# Patient Record
Sex: Female | Born: 1980 | Race: Asian | Hispanic: No | Marital: Married
Health system: Northeastern US, Academic
[De-identification: ages and names within clinical notes are randomized; demographics above are authoritative.]

## PROBLEM LIST (undated history)

## (undated) ENCOUNTER — Inpatient Hospital Stay (HOSPITAL_COMMUNITY): Payer: Self-pay

## (undated) DIAGNOSIS — Z8619 Personal history of other infectious and parasitic diseases: Secondary | ICD-10-CM

## (undated) DIAGNOSIS — J45909 Unspecified asthma, uncomplicated: Secondary | ICD-10-CM

## (undated) DIAGNOSIS — R51 Headache: Secondary | ICD-10-CM

## (undated) DIAGNOSIS — IMO0002 Reserved for concepts with insufficient information to code with codable children: Secondary | ICD-10-CM

## (undated) DIAGNOSIS — R87619 Unspecified abnormal cytological findings in specimens from cervix uteri: Secondary | ICD-10-CM

## (undated) HISTORY — DX: Personal history of other infectious and parasitic diseases: Z86.19

## (undated) HISTORY — PX: DILATION AND CURETTAGE OF UTERUS: SHX78

## (undated) HISTORY — DX: Headache: R51

## (undated) HISTORY — PX: MOUTH SURGERY: SHX715

## (undated) HISTORY — PX: COLONOSCOPY: SHX174

## (undated) HISTORY — PX: WISDOM TOOTH EXTRACTION: SHX21

---

## 2012-02-02 NOTE — L&D Delivery Note (Signed)
NSVD of [facility] at 1229 on 12/16/12.  EBL: 300cc.  APGARs 9,9.  Placenta to L&D. Head delivered ROA with body delivered atraumatically.  Mouth and nose bulb suctioned.  Cord clamped, cut and baby to abdomen.  Cord pH obtained.  Placenta delivered S/I/3VC.  Fundus firmed with pitocin and massage.  First degree perineal lac repaired with 3-0 Vicryl.  Mom and baby stable.

## 2012-05-17 LAB — OB RESULTS CONSOLE HIV ANTIBODY (ROUTINE TESTING): HIV: NONREACTIVE

## 2012-05-17 LAB — OB RESULTS CONSOLE ABO/RH: RH Type: POSITIVE

## 2012-05-17 LAB — OB RESULTS CONSOLE RUBELLA ANTIBODY, IGM: Rubella: IMMUNE

## 2012-05-17 LAB — OB RESULTS CONSOLE RPR: RPR: NONREACTIVE

## 2012-05-17 LAB — OB RESULTS CONSOLE HEPATITIS B SURFACE ANTIGEN: Hepatitis B Surface Ag: NEGATIVE

## 2012-09-22 ENCOUNTER — Encounter (HOSPITAL_COMMUNITY): Payer: Self-pay

## 2012-09-22 ENCOUNTER — Inpatient Hospital Stay (HOSPITAL_COMMUNITY)
Admission: AD | Admit: 2012-09-22 | Discharge: 2012-09-22 | Disposition: A | Discharge status: Home / Self Care | Payer: Managed Care, Other (non HMO) | Source: Ambulatory Visit | Attending: Obstetrics and Gynecology | Admitting: Obstetrics and Gynecology

## 2012-09-22 DIAGNOSIS — R109 Unspecified abdominal pain: Secondary | ICD-10-CM | POA: Insufficient documentation

## 2012-09-22 DIAGNOSIS — O99891 Other specified diseases and conditions complicating pregnancy: Secondary | ICD-10-CM | POA: Insufficient documentation

## 2012-09-22 DIAGNOSIS — O26899 Other specified pregnancy related conditions, unspecified trimester: Secondary | ICD-10-CM

## 2012-09-22 DIAGNOSIS — O9989 Other specified diseases and conditions complicating pregnancy, childbirth and the puerperium: Secondary | ICD-10-CM

## 2012-09-22 DIAGNOSIS — N949 Unspecified condition associated with female genital organs and menstrual cycle: Secondary | ICD-10-CM

## 2012-09-22 HISTORY — DX: Unspecified asthma, uncomplicated: J45.909

## 2012-09-22 LAB — URINALYSIS, ROUTINE W REFLEX MICROSCOPIC
Bilirubin Urine: NEGATIVE
Nitrite: NEGATIVE
Protein, ur: NEGATIVE mg/dL
Specific Gravity, Urine: <1.005 — ABNORMAL LOW (ref 1.005–1.030)
Urobilinogen, UA: 0.2 mg/dL (ref 0.0–1.0)

## 2012-09-22 LAB — WET PREP, GENITAL: Trich, Wet Prep: NONE SEEN

## 2012-09-22 LAB — URINE MICROSCOPIC-ADD ON

## 2012-09-22 NOTE — MAU Note (Signed)
Woke up at 1 this morning with menstrual like cramping. Denies LOF, VB or discharge. Positive fetal movement.

## 2012-09-22 NOTE — MAU Provider Note (Signed)
History     CSN: [id number]  Arrival date and time: 09/22/12 0226   First Provider Initiated Contact with Patient 09/22/12 0300      Chief Complaint  Patient presents with  . Abdominal Cramping   Abdominal Cramping    Tammy Foster is a 32 y.o. G1P0000 at [redacted]w[redacted]d who presents tonight with cramping. She states that around 0100 she woke up to a dull, lower abdominal ache. She states that the pain was constant and did not come and do. She states that it has gotten better now. She denies any bleeding or LOF and confirms fetal movement.   Past Medical History  Diagnosis Date  . Asthma     Past Surgical History  Procedure Laterality Date  . Mouth surgery      Family History  Problem Relation Age of Onset  . Adopted: Yes    History  Substance Use Topics  . Smoking status: Never Smoker   . Smokeless tobacco: Not on file  . Alcohol Use: No    Allergies:  Allergies  Allergen Reactions  . Neomycin Other (See Comments)    Reaction as child. "severe burn like reaction to skin"    Prescriptions prior to admission  Medication Sig Dispense Refill  . Prenatal Vit-Fe Fumarate-FA (PRENATAL MULTIVITAMIN) TABS tablet Take 1 tablet by mouth daily at 12 noon.        ROS Physical Exam   Blood pressure 117/79, pulse 90, temperature 98.7 F (37.1 C), temperature source Oral, resp. rate 18, height 5\' 4"  (1.626 m), weight 84.188 kg (185 lb 9.6 oz).  Physical Exam  Nursing note and vitals reviewed. Constitutional: She is oriented to person, place, and time. She appears well-developed and well-nourished. No distress.  Cardiovascular: Normal rate.   Respiratory: Effort normal.  GI: Soft. There is no tenderness.  Genitourinary:   External: no lesion Vagina: small amount of white discharge Cervix: pink, smooth, closed/thick/high  Uterus: AGA   Neurological: She is alert and oriented to person, place, and time.  Skin: Skin is warm and dry.  Psychiatric: She has a normal mood  and affect.   FHT: 140, moderate with 15x15 accels, no decels Toco: no UCs MAU Course  Procedures  Results for orders placed during the hospital encounter of 09/22/12 (from the past 24 hour(s))  URINALYSIS, ROUTINE W REFLEX MICROSCOPIC     Status: Abnormal   Collection Time    09/22/12  2:31 AM      Result Value Range   Color, Urine YELLOW  YELLOW   APPearance CLEAR  CLEAR   Specific Gravity, Urine <1.005 (*) 1.005 - 1.030   pH 7.0  5.0 - 8.0   Glucose, UA NEGATIVE  NEGATIVE mg/dL   Hgb urine dipstick TRACE (*) NEGATIVE   Bilirubin Urine NEGATIVE  NEGATIVE   Ketones, ur NEGATIVE  NEGATIVE mg/dL   Protein, ur NEGATIVE  NEGATIVE mg/dL   Urobilinogen, UA 0.2  0.0 - 1.0 mg/dL   Nitrite NEGATIVE  NEGATIVE   Leukocytes, UA NEGATIVE  NEGATIVE  URINE MICROSCOPIC-ADD ON     Status: Abnormal   Collection Time    09/22/12  2:31 AM      Result Value Range   Squamous Epithelial / LPF RARE  RARE   WBC, UA 0-2  <3 WBC/hpf   RBC / HPF 0-2  <3 RBC/hpf   Bacteria, UA FEW (*) RARE  WET PREP, GENITAL     Status: Abnormal   Collection Time    09/22/12  3:10 AM      Result Value Range   Yeast Wet Prep HPF POC NONE SEEN  NONE SEEN   Trich, Wet Prep NONE SEEN  NONE SEEN   Clue Cells Wet Prep HPF POC MODERATE (*) NONE SEEN   WBC, Wet Prep HPF POC FEW (*) NONE SEEN    Assessment and Plan   1. Pain of round ligament complicating pregnancy, antepartum    FU with the office as planned PTL danger signs reviewed Return to MAU as needed   Tawnya Crook 09/22/2012, 3:36 AM

## 2012-12-07 ENCOUNTER — Inpatient Hospital Stay (HOSPITAL_COMMUNITY)
Admission: AD | Admit: 2012-12-07 | Discharge: 2012-12-07 | Disposition: A | Discharge status: Home / Self Care | Payer: Managed Care, Other (non HMO) | Source: Ambulatory Visit | Attending: Obstetrics & Gynecology | Admitting: Obstetrics & Gynecology

## 2012-12-07 ENCOUNTER — Encounter (HOSPITAL_COMMUNITY): Payer: Self-pay

## 2012-12-07 DIAGNOSIS — M549 Dorsalgia, unspecified: Secondary | ICD-10-CM

## 2012-12-07 DIAGNOSIS — M7918 Myalgia, other site: Secondary | ICD-10-CM

## 2012-12-07 DIAGNOSIS — IMO0001 Reserved for inherently not codable concepts without codable children: Secondary | ICD-10-CM

## 2012-12-07 DIAGNOSIS — M545 Low back pain, unspecified: Secondary | ICD-10-CM | POA: Insufficient documentation

## 2012-12-07 DIAGNOSIS — O99891 Other specified diseases and conditions complicating pregnancy: Secondary | ICD-10-CM | POA: Insufficient documentation

## 2012-12-07 LAB — URINALYSIS, ROUTINE W REFLEX MICROSCOPIC
Glucose, UA: NEGATIVE mg/dL
Nitrite: NEGATIVE
Protein, ur: NEGATIVE mg/dL
Urobilinogen, UA: 0.2 mg/dL (ref 0.0–1.0)

## 2012-12-07 LAB — URINE MICROSCOPIC-ADD ON

## 2012-12-07 MED ORDER — CYCLOBENZAPRINE HCL 10 MG PO TABS
10.0000 mg | ORAL_TABLET | Freq: Once | ORAL | Status: AC
  Administered 2012-12-07: 10 mg via ORAL
  Filled 2012-12-07: qty 1

## 2012-12-07 MED ORDER — CYCLOBENZAPRINE HCL 5 MG PO TABS: 5.0000 mg | ORAL_TABLET | Freq: Three times a day (TID) | ORAL | Status: DC | PRN

## 2012-12-07 NOTE — MAU Note (Signed)
Pt states constant back pain that began around 0315. Nothing has helped. Denies vaginal bleeding and discharge. Good FM.

## 2012-12-07 NOTE — MAU Provider Note (Signed)
Chief Complaint:  Back Pain  First Provider Initiated Contact with Patient 12/07/12 0603     HPI: Tammy Foster is a 32 y.o. G1P0000 at [redacted]w[redacted]d who presents to maternity admissions reporting waking up with constant, severe low-mid back pain at 3:30 this morning. No improvement with ambulation, position changes. Denies contractions, leakage of fluid, vaginal bleeding, fever, chills, urinary complaints, recent injury or strain, . Good fetal movement.   Pregnancy Course: Uncomplicated.   Past Medical History: Past Medical History  Diagnosis Date  . Asthma     Past obstetric history: OB History  Gravida Para Term Preterm AB SAB TAB Ectopic Multiple Living  1 0 0 0 0 0 0 0 0 0     # Outcome Date GA Lbr Len/2nd Weight Sex Delivery Anes PTL Lv  1 CUR               Past Surgical History: Past Surgical History  Procedure Laterality Date  . Mouth surgery       Family History: Family History  Problem Relation Age of Onset  . Adopted: Yes    Social History: History  Substance Use Topics  . Smoking status: Never Smoker   . Smokeless tobacco: Not on file  . Alcohol Use: No    Allergies:  Allergies  Allergen Reactions  . Neomycin Other (See Comments)    Reaction as child. "severe burn like reaction to skin"    Meds:  Prescriptions prior to admission  Medication Sig Dispense Refill  . Prenatal Vit-Fe Fumarate-FA (PRENATAL MULTIVITAMIN) TABS tablet Take 1 tablet by mouth daily at 12 noon.        ROS: Pertinent findings in history of present illness.   Physical Exam  Blood pressure 120/79, pulse 73, temperature 97.7 F (36.5 C), temperature source Oral, resp. rate 18, SpO2 100.00%. GENERAL: Well-developed, well-nourished female in mild distress.   HEENT: normocephalic HEART: normal rate RESP: normal effort ABDOMEN: Soft, non-tender, gravid appropriate for gestational age. BACK: mild ML low and mid back tenderness. No CVAT.  EXTREMITIES: Nontender, no edema NEURO:  alert and oriented SPECULUM EXAM: NEFG, physiologic discharge, no blood, cervix clean Dilation: 1 Effacement (%): 70 Station: -3 Presentation: Vertex Exam by:: D Simpson RN  FHT:  Baseline 130's, moderate variability, accelerations present, no decelerations Contractions: None   Labs: Results for orders placed during the hospital encounter of 12/07/12 (from the past 24 hour(s))  URINALYSIS, ROUTINE W REFLEX MICROSCOPIC     Status: Abnormal   Collection Time    12/07/12  4:10 AM      Result Value Range   Color, Urine YELLOW  YELLOW   APPearance CLEAR  CLEAR   Specific Gravity, Urine 1.010  1.005 - 1.030   pH 6.5  5.0 - 8.0   Glucose, UA NEGATIVE  NEGATIVE mg/dL   Hgb urine dipstick NEGATIVE  NEGATIVE   Bilirubin Urine NEGATIVE  NEGATIVE   Ketones, ur NEGATIVE  NEGATIVE mg/dL   Protein, ur NEGATIVE  NEGATIVE mg/dL   Urobilinogen, UA 0.2  0.0 - 1.0 mg/dL   Nitrite NEGATIVE  NEGATIVE   Leukocytes, UA TRACE (*) NEGATIVE  URINE MICROSCOPIC-ADD ON     Status: None   Collection Time    12/07/12  4:10 AM      Result Value Range   Squamous Epithelial / LPF RARE  RARE   WBC, UA 0-2  <3 WBC/hpf   Bacteria, UA RARE  RARE    Imaging:  No results found. MAU Course: Apply  heat to low back. Flexor ordered. Encouraged patient to avoid semi-Fowler's position.  Pain 2/10 now. Feeling much better.   Assessment: 1. Musculoskeletal pain   2. Back pain complicating pregnancy in third trimester     Plan: Discharge home in stable condition. Labor precautions and fetal kick counts. Comfort measures.  Follow-up Information   Follow up with MORRIS, MEGAN, DO. (as scheduled or as needed if symptoms worsen)    Specialty:  Obstetrics and Gynecology   Contact information:   [address], [address] [place] [address] [phone]       Follow up with [facility] [place] MATERN[facility] ADMISSIONS. (As needed if symptoms worsen)    Contact  information:   [address] [id number] [place] [address] [phone]        Medication List         cyclobenzaprine 5 MG tablet  Commonly known as:  FLEXERIL  Take 1-2 tablets (5-10 mg total) by mouth 3 (three) times daily as needed for muscle spasms.     prenatal multivitamin Tabs tablet  Take 1 tablet by mouth daily at 12 noon.       [place], CNM 12/07/2012 6:27 AM

## 2012-12-07 NOTE — MAU Note (Signed)
Per V Smith CNM, pt may come off monitor 

## 2012-12-15 ENCOUNTER — Telehealth (HOSPITAL_COMMUNITY): Payer: Self-pay | Admitting: *Deleted

## 2012-12-15 ENCOUNTER — Encounter (HOSPITAL_COMMUNITY): Payer: Self-pay | Admitting: *Deleted

## 2012-12-15 LAB — OB RESULTS CONSOLE GBS: GBS: NEGATIVE

## 2012-12-15 NOTE — Telephone Encounter (Signed)
Preadmission screen  

## 2012-12-16 ENCOUNTER — Encounter (HOSPITAL_COMMUNITY): Payer: Managed Care, Other (non HMO) | Admitting: Anesthesiology

## 2012-12-16 ENCOUNTER — Inpatient Hospital Stay (HOSPITAL_COMMUNITY): Payer: Managed Care, Other (non HMO) | Admitting: Anesthesiology

## 2012-12-16 ENCOUNTER — Encounter (HOSPITAL_COMMUNITY): Payer: Self-pay | Admitting: *Deleted

## 2012-12-16 ENCOUNTER — Inpatient Hospital Stay (HOSPITAL_COMMUNITY)
Admission: AD | Admit: 2012-12-16 | Discharge: 2012-12-18 | DRG: 775 | Disposition: A | Discharge status: Home / Self Care | Payer: Managed Care, Other (non HMO) | Source: Ambulatory Visit | Attending: Obstetrics & Gynecology | Admitting: Obstetrics & Gynecology

## 2012-12-16 DIAGNOSIS — O368190 Decreased fetal movements, unspecified trimester, not applicable or unspecified: Secondary | ICD-10-CM | POA: Diagnosis present

## 2012-12-16 HISTORY — DX: Unspecified abnormal cytological findings in specimens from cervix uteri: R87.619

## 2012-12-16 HISTORY — DX: Reserved for concepts with insufficient information to code with codable children: IMO0002

## 2012-12-16 LAB — CBC
Hemoglobin: 14 g/dL (ref 12.0–15.0)
MCH: 30.1 pg (ref 26.0–34.0)
MCV: 86 fL (ref 78.0–100.0)
Platelets: 217 10*3/uL (ref 150–400)
RBC: 4.65 MIL/uL (ref 3.87–5.11)
RDW: 13.5 % (ref 11.5–15.5)
WBC: 11.5 10*3/uL — ABNORMAL HIGH (ref 4.0–10.5)

## 2012-12-16 LAB — RPR: RPR Ser Ql: NONREACTIVE

## 2012-12-16 LAB — TYPE AND SCREEN
ABO/RH(D): O POS
Antibody Screen: NEGATIVE

## 2012-12-16 LAB — ABO/RH: ABO/RH(D): O POS

## 2012-12-16 MED ORDER — OXYCODONE-ACETAMINOPHEN 5-325 MG PO TABS: 1.0000 | ORAL_TABLET | ORAL | Status: DC | PRN

## 2012-12-16 MED ORDER — DIPHENHYDRAMINE HCL 25 MG PO CAPS: 25.0000 mg | ORAL_CAPSULE | Freq: Four times a day (QID) | ORAL | Status: DC | PRN

## 2012-12-16 MED ORDER — ONDANSETRON HCL 4 MG PO TABS: 4.0000 mg | ORAL_TABLET | ORAL | Status: DC | PRN

## 2012-12-16 MED ORDER — ZOLPIDEM TARTRATE 5 MG PO TABS: 5.0000 mg | ORAL_TABLET | Freq: Every evening | ORAL | Status: DC | PRN

## 2012-12-16 MED ORDER — PHENYLEPHRINE 40 MCG/ML (10ML) SYRINGE FOR IV PUSH (FOR BLOOD PRESSURE SUPPORT)
80.0000 ug | PREFILLED_SYRINGE | INTRAVENOUS | Status: DC | PRN
  Filled 2012-12-16: qty 2
  Filled 2012-12-16: qty 10

## 2012-12-16 MED ORDER — IBUPROFEN 600 MG PO TABS
600.0000 mg | ORAL_TABLET | Freq: Four times a day (QID) | ORAL | Status: DC
  Administered 2012-12-16 – 2012-12-18 (×7): 600 mg via ORAL
  Filled 2012-12-16 (×9): qty 1

## 2012-12-16 MED ORDER — ONDANSETRON HCL 4 MG/2ML IJ SOLN: 4.0000 mg | INTRAMUSCULAR | Status: DC | PRN

## 2012-12-16 MED ORDER — ACETAMINOPHEN 325 MG PO TABS: 650.0000 mg | ORAL_TABLET | ORAL | Status: DC | PRN

## 2012-12-16 MED ORDER — DIBUCAINE 1 % RE OINT: 1.0000 "application " | TOPICAL_OINTMENT | RECTAL | Status: DC | PRN

## 2012-12-16 MED ORDER — ONDANSETRON HCL 4 MG/2ML IJ SOLN: 4.0000 mg | Freq: Four times a day (QID) | INTRAMUSCULAR | Status: DC | PRN

## 2012-12-16 MED ORDER — FENTANYL 2.5 MCG/ML BUPIVACAINE 1/10 % EPIDURAL INFUSION (WH - ANES)
14.0000 mL/h | INTRAMUSCULAR | Status: DC | PRN
  Administered 2012-12-16: 14 mL/h via EPIDURAL
  Filled 2012-12-16 (×2): qty 125

## 2012-12-16 MED ORDER — LACTATED RINGERS IV SOLN
INTRAVENOUS | Status: DC
  Administered 2012-12-16: 03:00:00 via INTRAVENOUS

## 2012-12-16 MED ORDER — LACTATED RINGERS IV SOLN
500.0000 mL | Freq: Once | INTRAVENOUS | Status: AC
  Administered 2012-12-16: 500 mL via INTRAVENOUS

## 2012-12-16 MED ORDER — LACTATED RINGERS IV SOLN: 500.0000 mL | INTRAVENOUS | Status: DC | PRN

## 2012-12-16 MED ORDER — SIMETHICONE 80 MG PO CHEW: 80.0000 mg | CHEWABLE_TABLET | ORAL | Status: DC | PRN

## 2012-12-16 MED ORDER — OXYTOCIN 40 UNITS IN LACTATED RINGERS INFUSION - SIMPLE MED
62.5000 mL/h | INTRAVENOUS | Status: DC
  Filled 2012-12-16: qty 1000

## 2012-12-16 MED ORDER — LIDOCAINE HCL (PF) 1 % IJ SOLN
INTRAMUSCULAR | Status: DC | PRN
  Administered 2012-12-16: 3 mL
  Administered 2012-12-16 (×2): 5 mL

## 2012-12-16 MED ORDER — EPHEDRINE 5 MG/ML INJ
10.0000 mg | INTRAVENOUS | Status: DC | PRN
  Filled 2012-12-16: qty 2
  Filled 2012-12-16: qty 4

## 2012-12-16 MED ORDER — PRENATAL MULTIVITAMIN CH
1.0000 | ORAL_TABLET | Freq: Every day | ORAL | Status: DC
  Administered 2012-12-17 – 2012-12-18 (×2): 1 via ORAL
  Filled 2012-12-16 (×2): qty 1

## 2012-12-16 MED ORDER — LANOLIN HYDROUS EX OINT: TOPICAL_OINTMENT | CUTANEOUS | Status: DC | PRN

## 2012-12-16 MED ORDER — WITCH HAZEL-GLYCERIN EX PADS: 1.0000 "application " | MEDICATED_PAD | CUTANEOUS | Status: DC | PRN

## 2012-12-16 MED ORDER — IBUPROFEN 600 MG PO TABS: 600.0000 mg | ORAL_TABLET | Freq: Four times a day (QID) | ORAL | Status: DC | PRN

## 2012-12-16 MED ORDER — PHENYLEPHRINE 40 MCG/ML (10ML) SYRINGE FOR IV PUSH (FOR BLOOD PRESSURE SUPPORT)
80.0000 ug | PREFILLED_SYRINGE | INTRAVENOUS | Status: DC | PRN
  Filled 2012-12-16: qty 2

## 2012-12-16 MED ORDER — CITRIC ACID-SODIUM CITRATE 334-500 MG/5ML PO SOLN: 30.0000 mL | ORAL | Status: DC | PRN

## 2012-12-16 MED ORDER — BENZOCAINE-MENTHOL 20-0.5 % EX AERO
1.0000 "application " | INHALATION_SPRAY | CUTANEOUS | Status: DC | PRN
  Filled 2012-12-16: qty 56

## 2012-12-16 MED ORDER — OXYTOCIN BOLUS FROM INFUSION
500.0000 mL | INTRAVENOUS | Status: DC
  Administered 2012-12-16: 500 mL via INTRAVENOUS

## 2012-12-16 MED ORDER — LIDOCAINE HCL (PF) 1 % IJ SOLN
30.0000 mL | INTRAMUSCULAR | Status: DC | PRN
  Filled 2012-12-16 (×2): qty 30

## 2012-12-16 MED ORDER — EPHEDRINE 5 MG/ML INJ
10.0000 mg | INTRAVENOUS | Status: DC | PRN
  Filled 2012-12-16: qty 2

## 2012-12-16 MED ORDER — DIPHENHYDRAMINE HCL 50 MG/ML IJ SOLN: 12.5000 mg | INTRAMUSCULAR | Status: DC | PRN

## 2012-12-16 MED ORDER — TETANUS-DIPHTH-ACELL PERTUSSIS 5-2.5-18.5 LF-MCG/0.5 IM SUSP: 0.5000 mL | Freq: Once | INTRAMUSCULAR | Status: DC

## 2012-12-16 MED ORDER — FENTANYL 2.5 MCG/ML BUPIVACAINE 1/10 % EPIDURAL INFUSION (WH - ANES)
INTRAMUSCULAR | Status: DC | PRN
  Administered 2012-12-16: 14 mL/h via EPIDURAL

## 2012-12-16 MED ORDER — FLEET ENEMA 7-19 GM/118ML RE ENEM: 1.0000 | ENEMA | RECTAL | Status: DC | PRN

## 2012-12-16 MED ORDER — SENNOSIDES-DOCUSATE SODIUM 8.6-50 MG PO TABS
2.0000 | ORAL_TABLET | ORAL | Status: DC
  Administered 2012-12-17 (×2): 2 via ORAL
  Filled 2012-12-16 (×2): qty 2

## 2012-12-16 NOTE — Anesthesia Postprocedure Evaluation (Signed)
Anesthesia Post Note  Patient: Tammy Foster  Procedure(s) Performed: * No procedures listed *  Anesthesia type: Epidural  Patient location: Mother/Baby  Post pain: Pain level controlled  Post assessment: Post-op Vital signs reviewed  Last Vitals:  Filed Vitals:   12/16/12 1545  BP: 126/89  Pulse: 91  Temp: 36.8 C  Resp: 18    Post vital signs: Reviewed  Level of consciousness:alert  Complications: No apparent anesthesia complications

## 2012-12-16 NOTE — Anesthesia Preprocedure Evaluation (Signed)

## 2012-12-16 NOTE — Anesthesia Procedure Notes (Signed)
Epidural Patient location during procedure: OB  Staffing Anesthesiologist: Claiborne Stroble Performed by: anesthesiologist   Preanesthetic Checklist Completed: patient identified, site marked, surgical consent, pre-op evaluation, timeout performed, IV checked, risks and benefits discussed and monitors and equipment checked  Epidural Patient position: sitting Prep: ChloraPrep Patient monitoring: heart rate, continuous pulse ox and blood pressure Approach: right paramedian Injection technique: LOR saline  Needle:  Needle type: Tuohy  Needle gauge: 17 G Needle length: 9 cm and 9 Needle insertion depth: 6 cm Catheter type: closed end flexible Catheter size: 20 Guage Catheter at skin depth: 11 cm Test dose: negative  Assessment Events: blood not aspirated, injection not painful, no injection resistance, negative IV test and no paresthesia  Additional Notes   Patient tolerated the insertion well without complications.   

## 2012-12-16 NOTE — Progress Notes (Signed)
Dr Langston Masker notified of patient, tracing, ctx pattern, sve result. Order to admit to l/d unit.

## 2012-12-16 NOTE — H&P (Signed)
Rickelle Sylvestre is a 32 y.o. female presenting for labor.  CTX started last night around 8pm and gradually intensified.  No VB, LOF.  +FM.  Antepartum course uncomplicated.  GBS negative.  Patient is comfortable with epidural.   Maternal Medical History:  Reason for admission: Contractions.   Contractions: Onset was 6-12 hours ago.   Frequency: irregular.   Perceived severity is mild.    Fetal activity: Perceived fetal activity is normal.   Last perceived fetal movement was within the past hour.    Prenatal complications: no prenatal complications Prenatal Complications - Diabetes: none.    OB History   Grav Para Term Preterm Abortions TAB SAB Ect Mult Living   1 0 0 0 0 0 0 0 0 0      Past Medical History  Diagnosis Date  . Asthma   . Hx of varicella   . Headache(784.0)   . Abnormal Pap smear    Past Surgical History  Procedure Laterality Date  . Mouth surgery     Family History: family history is not on file. She was adopted. Social History:  reports that she has never smoked. She has never used smokeless tobacco. She reports that she does not drink alcohol or use illicit drugs.   Prenatal Transfer Tool  Maternal Diabetes: No Genetic Screening: Normal Maternal Ultrasounds/Referrals: Normal Fetal Ultrasounds or other Referrals:  None Maternal Substance Abuse:  No Significant Maternal Medications:  None Significant Maternal Lab Results:  Lab values include: Group B Strep negative Other Comments:  None  ROS  Dilation: 5 Effacement (%): 100 Station: -1 Exam by:: foley,rn Blood pressure 114/63, pulse 92, temperature 98.7 F (37.1 C), temperature source Oral, resp. rate 18, height 5\' 4"  (1.626 m), weight 191 lb (86.637 kg), SpO2 100.00%. Maternal Exam:  Uterine Assessment: Contraction strength is moderate.  Contraction frequency is irregular.   Abdomen: Patient reports no abdominal tenderness. Fundal height is c/w dates.   Estimated fetal weight is 7#8.   Fetal  presentation: vertex  Introitus: Normal vulva. Normal vagina.  Amniotic fluid character: clear.  Pelvis: adequate for delivery.   Cervix: Cervix evaluated by digital exam.     Physical Exam  Constitutional: She is oriented to person, place, and time. She appears well-developed and well-nourished.  GI: Soft. There is no rebound and no guarding.  Neurological: She is alert and oriented to person, place, and time.  Skin: Skin is warm and dry.  Psychiatric: She has a normal mood and affect. Her behavior is normal.    Prenatal labs: ABO, Rh: --/--/O POS, O POS (11/15 0230) Antibody: NEG (11/15 0230) Rubella: Immune (04/16 0000) RPR: Nonreactive (04/16 0000)  HBsAg: Negative (04/16 0000)  HIV: Non-reactive (04/16 0000)  GBS: Negative (11/14 0000)   Assessment/Plan: 32yo G1 at [redacted]w[redacted]d with labor AROM Add pitocin as needed Anticipate NSVD    Marda Breidenbach 12/16/2012, 6:39 AM

## 2012-12-16 NOTE — MAU Note (Signed)
Patient is for labor eval. Patient c/o ctx every 1-6 minutes apart since 2000pm. She denies LOF. Reports decreased fetal movement since the contractions became more intense. Patient states that she was 1.5cm this week. She notices dark discharge when voided.

## 2012-12-17 LAB — CBC
HCT: 34.3 % — ABNORMAL LOW (ref 36.0–46.0)
Hemoglobin: 11.7 g/dL — ABNORMAL LOW (ref 12.0–15.0)
MCHC: 34.1 g/dL (ref 30.0–36.0)
MCV: 86.6 fL (ref 78.0–100.0)
RBC: 3.96 MIL/uL (ref 3.87–5.11)
WBC: 11.7 10*3/uL — ABNORMAL HIGH (ref 4.0–10.5)

## 2012-12-17 NOTE — Progress Notes (Signed)
Post Partum Day 1 Subjective: no complaints, up ad lib, voiding and tolerating PO.  Patient wants circ today.  Objective: Blood pressure 102/56, pulse 69, temperature 97.5 F (36.4 C), temperature source Oral, resp. rate 18, height 5\' 4"  (1.626 m), weight 191 lb (86.637 kg), SpO2 98.00%, unknown if currently breastfeeding.  Physical Exam:  General: alert, cooperative and appears stated age Lochia: appropriate Uterine Fundus: firm Incision: healing well DVT Evaluation: No evidence of DVT seen on physical exam. Negative Homan's sign. No cords or calf tenderness.   Recent Labs  12/16/12 0230 12/17/12 0610  HGB 14.0 11.7*  HCT 40.0 34.3*    Assessment/Plan: Plan for discharge tomorrow, Breastfeeding and Circumcision prior to discharge Counseled for circ including risk of bleeding, infection and scarring.  All questions were answered.     LOS: 1 day   Huston Stonehocker 12/17/2012, 9:50 AM

## 2012-12-18 MED ORDER — IBUPROFEN 600 MG PO TABS: 600.0000 mg | ORAL_TABLET | Freq: Four times a day (QID) | ORAL | Status: DC

## 2012-12-18 NOTE — Discharge Summary (Signed)
Obstetric Discharge Summary Reason for Admission: onset of labor Prenatal Procedures: ultrasound Intrapartum Procedures: spontaneous vaginal delivery Postpartum Procedures: none Complications-Operative and Postpartum: 1 degree perineal laceration Hemoglobin  Date Value Range Status  12/17/2012 11.7* 12.0 - 15.0 Foster/dL Final     HCT  Date Value Range Status  12/17/2012 34.3* 36.0 - 46.0 % Final    Physical Exam:  General: alert and cooperative Lochia: appropriate Uterine Fundus: firm Incision: healing well DVT Evaluation: No evidence of DVT seen on physical exam. Negative Homan's sign. No cords or calf tenderness. No significant calf/ankle edema.  Discharge Diagnoses: Term Pregnancy-delivered  Discharge Information: Date: 12/18/2012 Activity: pelvic rest Diet: routine Medications: PNV and Ibuprofen Condition: stable Instructions: refer to practice specific booklet Discharge to: home   Newborn Data: Live born female  Birth Weight: 8 lb 0.9 oz (3655 Foster) APGAR: 9, 9  Home with mother.  Tammy Foster 12/18/2012, 8:50 AM

## 2012-12-18 NOTE — Lactation Note (Signed)
This note was copied from the chart of Tammy Foster. Lactation Consultation Note Mom holding baby in cross cradle on right side when I enter room. Occasional audible swallows. Mom states she is comfortable, no nipple or breast pain.  Mom states breast feeding is going very well. Mom and dad had numerous questions. Discussed their questions. Enc mom to call lactation office if she has any concerns, and to attend the BFSG. Patient Name: Tammy Foster [id number] Date: 12/18/2012 Reason for consult: Follow-up assessment   Maternal Data    Feeding Feeding Type: Breast Fed Length of feed: 10 min  LATCH Score/Interventions Latch: Grasps breast easily, tongue down, lips flanged, rhythmical sucking.  Audible Swallowing: A few with stimulation  Type of Nipple: Everted at rest and after stimulation  Comfort (Breast/Nipple): Soft / non-tender     Hold (Positioning): No assistance needed to correctly position infant at breast.  LATCH Score: 9  Lactation Tools Discussed/Used     Consult Status Consult Status: Complete    Lenard Forth 12/18/2012, 11:32 AM

## 2012-12-22 ENCOUNTER — Inpatient Hospital Stay (HOSPITAL_COMMUNITY): Admission: RE | Admit: 2012-12-22 | Payer: Managed Care, Other (non HMO) | Source: Ambulatory Visit

## 2013-12-03 ENCOUNTER — Encounter (HOSPITAL_COMMUNITY): Payer: Self-pay | Admitting: *Deleted

## 2014-11-13 LAB — OB RESULTS CONSOLE GC/CHLAMYDIA
Chlamydia: NEGATIVE
Gonorrhea: NEGATIVE

## 2014-11-13 LAB — OB RESULTS CONSOLE ABO/RH: RH Type: POSITIVE

## 2014-11-13 LAB — OB RESULTS CONSOLE HEPATITIS B SURFACE ANTIGEN: HEP B S AG: NEGATIVE

## 2014-11-13 LAB — OB RESULTS CONSOLE ANTIBODY SCREEN: ANTIBODY SCREEN: NEGATIVE

## 2014-11-13 LAB — OB RESULTS CONSOLE RUBELLA ANTIBODY, IGM: RUBELLA: IMMUNE

## 2014-11-13 LAB — OB RESULTS CONSOLE HIV ANTIBODY (ROUTINE TESTING): HIV: NONREACTIVE

## 2014-11-13 LAB — OB RESULTS CONSOLE RPR: RPR: NONREACTIVE

## 2015-06-19 ENCOUNTER — Telehealth (HOSPITAL_COMMUNITY): Payer: Self-pay | Admitting: *Deleted

## 2015-06-19 ENCOUNTER — Encounter (HOSPITAL_COMMUNITY): Payer: Self-pay | Admitting: *Deleted

## 2015-06-19 LAB — OB RESULTS CONSOLE GBS: GBS: POSITIVE

## 2015-06-19 NOTE — Telephone Encounter (Signed)
Preadmission screen  

## 2015-06-21 ENCOUNTER — Inpatient Hospital Stay (HOSPITAL_COMMUNITY): Payer: Managed Care, Other (non HMO) | Admitting: Anesthesiology

## 2015-06-21 ENCOUNTER — Inpatient Hospital Stay (HOSPITAL_COMMUNITY)
Admission: AD | Admit: 2015-06-21 | Discharge: 2015-06-23 | DRG: 775 | Disposition: A | Discharge status: Home / Self Care | Payer: Managed Care, Other (non HMO) | Source: Ambulatory Visit | Attending: Obstetrics and Gynecology | Admitting: Obstetrics and Gynecology

## 2015-06-21 ENCOUNTER — Encounter (HOSPITAL_COMMUNITY): Payer: Self-pay | Admitting: *Deleted

## 2015-06-21 DIAGNOSIS — Z3A40 40 weeks gestation of pregnancy: Secondary | ICD-10-CM

## 2015-06-21 DIAGNOSIS — IMO0001 Reserved for inherently not codable concepts without codable children: Secondary | ICD-10-CM

## 2015-06-21 LAB — CBC
HEMATOCRIT: 36.7 % (ref 36.0–46.0)
HEMOGLOBIN: 12.3 g/dL (ref 12.0–15.0)
MCH: 29.4 pg (ref 26.0–34.0)
MCHC: 33.5 g/dL (ref 30.0–36.0)
MCV: 87.6 fL (ref 78.0–100.0)
Platelets: 213 10*3/uL (ref 150–400)
RBC: 4.19 MIL/uL (ref 3.87–5.11)
RDW: 14.3 % (ref 11.5–15.5)
WBC: 6.6 10*3/uL (ref 4.0–10.5)

## 2015-06-21 LAB — TYPE AND SCREEN
ABO/RH(D): O POS
Antibody Screen: NEGATIVE

## 2015-06-21 MED ORDER — OXYCODONE-ACETAMINOPHEN 5-325 MG PO TABS: 2.0000 | ORAL_TABLET | ORAL | Status: DC | PRN

## 2015-06-21 MED ORDER — OXYTOCIN 40 UNITS IN LACTATED RINGERS INFUSION - SIMPLE MED
2.5000 [IU]/h | INTRAVENOUS | Status: DC
  Filled 2015-06-21: qty 1000

## 2015-06-21 MED ORDER — LACTATED RINGERS IV SOLN
INTRAVENOUS | Status: DC
  Administered 2015-06-21: 23:00:00 via INTRAVENOUS

## 2015-06-21 MED ORDER — CITRIC ACID-SODIUM CITRATE 334-500 MG/5ML PO SOLN: 30.0000 mL | ORAL | Status: DC | PRN

## 2015-06-21 MED ORDER — PHENYLEPHRINE 40 MCG/ML (10ML) SYRINGE FOR IV PUSH (FOR BLOOD PRESSURE SUPPORT)
80.0000 ug | PREFILLED_SYRINGE | INTRAVENOUS | Status: DC | PRN
Start: 2015-06-21 — End: 2015-06-22
  Filled 2015-06-21: qty 5

## 2015-06-21 MED ORDER — ONDANSETRON HCL 4 MG/2ML IJ SOLN: 4.0000 mg | Freq: Four times a day (QID) | INTRAMUSCULAR | Status: DC | PRN

## 2015-06-21 MED ORDER — EPHEDRINE 5 MG/ML INJ
10.0000 mg | INTRAVENOUS | Status: DC | PRN
Start: 2015-06-21 — End: 2015-06-22
  Filled 2015-06-21: qty 2

## 2015-06-21 MED ORDER — OXYTOCIN BOLUS FROM INFUSION
500.0000 mL | INTRAVENOUS | Status: DC
  Administered 2015-06-22: 500 mL via INTRAVENOUS

## 2015-06-21 MED ORDER — PENICILLIN G POTASSIUM 5000000 UNITS IJ SOLR
5.0000 10*6.[IU] | Freq: Once | INTRAVENOUS | Status: AC
  Administered 2015-06-21: 5 10*6.[IU] via INTRAVENOUS
  Filled 2015-06-21: qty 5

## 2015-06-21 MED ORDER — FENTANYL 2.5 MCG/ML BUPIVACAINE 1/10 % EPIDURAL INFUSION (WH - ANES)
14.0000 mL/h | INTRAMUSCULAR | Status: DC | PRN
  Administered 2015-06-21 – 2015-06-22 (×2): 14 mL/h via EPIDURAL
  Filled 2015-06-21: qty 125

## 2015-06-21 MED ORDER — PHENYLEPHRINE 40 MCG/ML (10ML) SYRINGE FOR IV PUSH (FOR BLOOD PRESSURE SUPPORT)
80.0000 ug | PREFILLED_SYRINGE | INTRAVENOUS | Status: DC | PRN
  Filled 2015-06-21: qty 5

## 2015-06-21 MED ORDER — LACTATED RINGERS IV SOLN
500.0000 mL | Freq: Once | INTRAVENOUS | Status: AC
  Administered 2015-06-21: 500 mL via INTRAVENOUS

## 2015-06-21 MED ORDER — ACETAMINOPHEN 325 MG PO TABS: 650.0000 mg | ORAL_TABLET | ORAL | Status: DC | PRN

## 2015-06-21 MED ORDER — OXYCODONE-ACETAMINOPHEN 5-325 MG PO TABS: 1.0000 | ORAL_TABLET | ORAL | Status: DC | PRN

## 2015-06-21 MED ORDER — DEXTROSE 5 % IV SOLN
2.5000 10*6.[IU] | INTRAVENOUS | Status: DC
  Administered 2015-06-22 (×2): 2.5 10*6.[IU] via INTRAVENOUS
  Filled 2015-06-21 (×5): qty 2.5

## 2015-06-21 MED ORDER — FLEET ENEMA 7-19 GM/118ML RE ENEM: 1.0000 | ENEMA | RECTAL | Status: DC | PRN

## 2015-06-21 MED ORDER — LACTATED RINGERS IV SOLN: 500.0000 mL | INTRAVENOUS | Status: DC | PRN

## 2015-06-21 MED ORDER — FENTANYL 2.5 MCG/ML BUPIVACAINE 1/10 % EPIDURAL INFUSION (WH - ANES)
INTRAMUSCULAR | Status: AC
  Administered 2015-06-21: 23:00:00
  Filled 2015-06-21: qty 125

## 2015-06-21 MED ORDER — PHENYLEPHRINE 40 MCG/ML (10ML) SYRINGE FOR IV PUSH (FOR BLOOD PRESSURE SUPPORT)
PREFILLED_SYRINGE | INTRAVENOUS | Status: AC
  Filled 2015-06-21: qty 20

## 2015-06-21 MED ORDER — LIDOCAINE HCL (PF) 1 % IJ SOLN
30.0000 mL | INTRAMUSCULAR | Status: DC | PRN
  Filled 2015-06-21: qty 30

## 2015-06-21 MED ORDER — LIDOCAINE HCL (PF) 1 % IJ SOLN
INTRAMUSCULAR | Status: DC | PRN
  Administered 2015-06-21 (×2): 4 mL via EPIDURAL

## 2015-06-21 MED ORDER — DIPHENHYDRAMINE HCL 50 MG/ML IJ SOLN: 12.5000 mg | INTRAMUSCULAR | Status: DC | PRN

## 2015-06-21 NOTE — MAU Note (Signed)
Pt reports having ctx since 4;30pm. About 5 min. reports some pinkish discharge.

## 2015-06-21 NOTE — Anesthesia Preprocedure Evaluation (Signed)
Anesthesia Evaluation  Patient identified by MRN, date of birth, ID band Patient awake    Reviewed: Allergy & Precautions, NPO status , Patient's Chart, lab work & pertinent test results  Airway Mallampati: II  TM Distance: >3 FB Neck ROM: Full    Dental  (+) Teeth Intact   Pulmonary asthma ,    breath sounds clear to auscultation       Cardiovascular negative cardio ROS   Rhythm:Regular Rate:Normal     Neuro/Psych  Headaches, negative psych ROS   GI/Hepatic negative GI ROS, Neg liver ROS,   Endo/Other  negative endocrine ROS  Renal/GU negative Renal ROS  negative genitourinary   Musculoskeletal negative musculoskeletal ROS (+)   Abdominal   Peds  Hematology negative hematology ROS (+)   Anesthesia Other Findings   Reproductive/Obstetrics (+) Pregnancy                             Lab Results  Component Value Date   WBC 6.6 06/21/2015   HGB 12.3 06/21/2015   HCT 36.7 06/21/2015   MCV 87.6 06/21/2015   PLT 213 06/21/2015   No results found for: INR, PROTIME   Anesthesia Physical Anesthesia Plan  ASA: II  Anesthesia Plan: Epidural   Post-op Pain Management:    Induction:   Airway Management Planned:   Additional Equipment:   Intra-op Plan:   Post-operative Plan:   Informed Consent: I have reviewed the patients History and Physical, chart, labs and discussed the procedure including the risks, benefits and alternatives for the proposed anesthesia with the patient or authorized representative who has indicated his/her understanding and acceptance.     Plan Discussed with:   Anesthesia Plan Comments:         Anesthesia Quick Evaluation

## 2015-06-21 NOTE — Anesthesia Procedure Notes (Signed)
Epidural Patient location during procedure: OB Start time: 06/21/2015 11:10 PM End time: 06/21/2015 11:17 PM  Staffing Anesthesiologist: Shona SimpsonHOLLIS, KEVIN D Performed by: anesthesiologist   Preanesthetic Checklist Completed: patient identified, site marked, surgical consent, pre-op evaluation, timeout performed, IV checked, risks and benefits discussed and monitors and equipment checked  Epidural Patient position: sitting Prep: ChloraPrep Patient monitoring: heart rate, continuous pulse ox and blood pressure Approach: midline Location: L3-L4 Injection technique: LOR saline  Needle:  Needle type: Tuohy  Needle gauge: 17 G Needle length: 9 cm Catheter type: closed end flexible Catheter size: 20 Guage Test dose: negative and 1.5% lidocaine  Assessment Events: blood not aspirated, injection not painful, no injection resistance and no paresthesia  Additional Notes LOR @ 5  Patient identified. Risks/Benefits/Options discussed with patient including but not limited to bleeding, infection, nerve damage, paralysis, failed block, incomplete pain control, headache, blood pressure changes, nausea, vomiting, reactions to medications, itching and postpartum back pain. Confirmed with bedside nurse the patient's most recent platelet count. Confirmed with patient that they are not currently taking any anticoagulation, have any bleeding history or any family history of bleeding disorders. Patient expressed understanding and wished to proceed. All questions were answered. Sterile technique was used throughout the entire procedure. Please see nursing notes for vital signs. Test dose was given through epidural catheter and negative prior to continuing to dose epidural or start infusion. Warning signs of high block given to the patient including shortness of breath, tingling/numbness in hands, complete motor block, or any concerning symptoms with instructions to call for help. Patient was given instructions on  fall risk and not to get out of bed. All questions and concerns addressed with instructions to call with any issues or inadequate analgesia.    Reason for block:procedure for pain

## 2015-06-22 ENCOUNTER — Encounter (HOSPITAL_COMMUNITY): Payer: Self-pay

## 2015-06-22 LAB — RPR: RPR Ser Ql: NONREACTIVE

## 2015-06-22 MED ORDER — DIPHENHYDRAMINE HCL 25 MG PO CAPS: 25.0000 mg | ORAL_CAPSULE | Freq: Four times a day (QID) | ORAL | Status: DC | PRN

## 2015-06-22 MED ORDER — COCONUT OIL OIL
1.0000 | TOPICAL_OIL | Status: DC | PRN
Start: 2015-06-22 — End: 2015-06-23

## 2015-06-22 MED ORDER — LACTATED RINGERS IV SOLN: 500.0000 mL | Freq: Once | INTRAVENOUS | Status: AC

## 2015-06-22 MED ORDER — SENNOSIDES-DOCUSATE SODIUM 8.6-50 MG PO TABS
2.0000 | ORAL_TABLET | ORAL | Status: DC
  Administered 2015-06-23: 2 via ORAL
  Filled 2015-06-22: qty 2

## 2015-06-22 MED ORDER — ONDANSETRON HCL 4 MG/2ML IJ SOLN: 4.0000 mg | INTRAMUSCULAR | Status: DC | PRN

## 2015-06-22 MED ORDER — PHENYLEPHRINE 40 MCG/ML (10ML) SYRINGE FOR IV PUSH (FOR BLOOD PRESSURE SUPPORT)
80.0000 ug | PREFILLED_SYRINGE | INTRAVENOUS | Status: DC | PRN
  Filled 2015-06-22: qty 5

## 2015-06-22 MED ORDER — IBUPROFEN 600 MG PO TABS
600.0000 mg | ORAL_TABLET | Freq: Four times a day (QID) | ORAL | Status: DC
  Administered 2015-06-22 – 2015-06-23 (×5): 600 mg via ORAL
  Filled 2015-06-22 (×5): qty 1

## 2015-06-22 MED ORDER — DIBUCAINE 1 % RE OINT: 1.0000 "application " | TOPICAL_OINTMENT | RECTAL | Status: DC | PRN

## 2015-06-22 MED ORDER — BENZOCAINE-MENTHOL 20-0.5 % EX AERO
1.0000 "application " | INHALATION_SPRAY | CUTANEOUS | Status: DC | PRN
  Administered 2015-06-22: 1 via TOPICAL
  Filled 2015-06-22: qty 56

## 2015-06-22 MED ORDER — SIMETHICONE 80 MG PO CHEW: 80.0000 mg | CHEWABLE_TABLET | ORAL | Status: DC | PRN

## 2015-06-22 MED ORDER — ZOLPIDEM TARTRATE 5 MG PO TABS: 5.0000 mg | ORAL_TABLET | Freq: Every evening | ORAL | Status: DC | PRN

## 2015-06-22 MED ORDER — PRENATAL MULTIVITAMIN CH
1.0000 | ORAL_TABLET | Freq: Every day | ORAL | Status: DC
  Administered 2015-06-22 – 2015-06-23 (×2): 1 via ORAL
  Filled 2015-06-22 (×2): qty 1

## 2015-06-22 MED ORDER — EPHEDRINE 5 MG/ML INJ
10.0000 mg | INTRAVENOUS | Status: DC | PRN
  Filled 2015-06-22: qty 2

## 2015-06-22 MED ORDER — TETANUS-DIPHTH-ACELL PERTUSSIS 5-2.5-18.5 LF-MCG/0.5 IM SUSP
0.5000 mL | Freq: Once | INTRAMUSCULAR | Status: DC
Start: 2015-06-23 — End: 2015-06-22

## 2015-06-22 MED ORDER — MEDROXYPROGESTERONE ACETATE 150 MG/ML IM SUSP: 150.0000 mg | INTRAMUSCULAR | Status: DC | PRN

## 2015-06-22 MED ORDER — ACETAMINOPHEN 325 MG PO TABS: 650.0000 mg | ORAL_TABLET | ORAL | Status: DC | PRN

## 2015-06-22 MED ORDER — ONDANSETRON HCL 4 MG PO TABS: 4.0000 mg | ORAL_TABLET | ORAL | Status: DC | PRN

## 2015-06-22 MED ORDER — WITCH HAZEL-GLYCERIN EX PADS: 1.0000 "application " | MEDICATED_PAD | CUTANEOUS | Status: DC | PRN

## 2015-06-22 NOTE — Progress Notes (Signed)
SVD of vigerous female infant w/ apgars of 9,9.  Placenta delivered spontaneous w/ 3VC.   1st degree lac repaired w/ 3-0 vicryl rapide.  Fundus firm.  EBL 150cc.

## 2015-06-22 NOTE — H&P (Signed)
Tammy Foster is a 35 y.o. female G3P1 presenting for SOL.  Denies vb or lof.  Pregnancy uncomplicated.  Now uncomplicated.  History OB History    Gravida Para Term Preterm AB TAB SAB Ectopic Multiple Living   3 1 1  0 1 0 1 0 0 1     Past Medical History  Diagnosis Date  . Asthma   . Hx of varicella   . Headache(784.0)   . Abnormal Pap smear   . Hx of varicella    Past Surgical History  Procedure Laterality Date  . Mouth surgery    . Dilation and curettage of uterus      endometrial polyp   Family History: family history is not on file. She was adopted. Social History:  reports that she has never smoked. She has never used smokeless tobacco. She reports that she does not drink alcohol or use illicit drugs.   Prenatal Transfer Tool  Maternal Diabetes: No Genetic Screening: Declined Maternal Ultrasounds/Referrals: Normal Fetal Ultrasounds or other Referrals:  None Maternal Substance Abuse:  No Significant Maternal Medications:  None Significant Maternal Lab Results:  None Other Comments:  None  ROS  Dilation: 10 Effacement (%): 100 Station: -1 Exam by:: amwalker,rn Blood pressure 118/84, pulse 77, temperature 98.6 F (37 C), temperature source Oral, resp. rate 18, height 5\' 4"  (1.626 m), weight 182 lb (82.555 kg), SpO2 100 %, unknown if currently breastfeeding. Exam Physical Exam  Gen - NAD CV - RRR Lungs - clear Abd - gravid, NT  EFW 8.5# Ext - NT Cvx - 5cm on admission AROM - now complete Prenatal labs: ABO, Rh: --/--/O POS (05/20 2205) Antibody: NEG (05/20 2205) Rubella: Immune (10/12 0000) RPR: Nonreactive (10/12 0000)  HBsAg: Negative (10/12 0000)  HIV: Non-reactive (10/12 0000)  GBS: Positive (05/18 0000)   Assessment/Plan: Admit Exp mngt    Deaysia Grigoryan 06/22/2015, 7:38 AM

## 2015-06-22 NOTE — Anesthesia Postprocedure Evaluation (Signed)
Anesthesia Post Note  Patient: Tammy Foster  Procedure(s) Performed: * No procedures listed *  Patient location during evaluation: Mother Baby Anesthesia Type: Epidural Level of consciousness: oriented and awake and alert Pain management: pain level controlled Vital Signs Assessment: post-procedure vital signs reviewed and stable Respiratory status: spontaneous breathing and nonlabored ventilation Cardiovascular status: stable Postop Assessment: epidural receding, patient able to bend at knees, no signs of nausea or vomiting and adequate PO intake Anesthetic complications: no     Last Vitals:  Filed Vitals:   06/22/15 1054 06/22/15 1228  BP: 129/74 114/76  Pulse: 84 89  Temp: 37 C 37.2 C  Resp: 16 18    Last Pain:  Filed Vitals:   06/22/15 1251  PainSc: 0-No pain   Pain Goal: Patients Stated Pain Goal: 3 (06/22/15 1100)               Anastasiya Gowin Hristova

## 2015-06-22 NOTE — Lactation Note (Signed)
This note was copied from a baby's chart. Lactation Consultation Note  Patient Name: Girl Barbaraann CaoKristina Deliz QMVHQ'IToday's Date: 06/22/2015 Reason for consult: Initial assessment Baby 7 hours old. Parents resting. Mom reports she nursed first baby 12 months without any issues. Mom states that this baby is nursing well. Enc mom to nurse with cues and call for assistance as needed. [facility], aware of OP/BFSG and LC phone line assistance after D/C.   Maternal Data Does the patient have breastfeeding experience prior to this delivery?: Yes  Feeding Feeding Type: Breast Fed  LATCH Score/Interventions Latch: Grasps breast easily, tongue down, lips flanged, rhythmical sucking.  Audible Swallowing: Spontaneous and intermittent  Type of Nipple: Everted at rest and after stimulation  Comfort (Breast/Nipple): Soft / non-tender     Hold (Positioning): Assistance needed to correctly position infant at breast and maintain latch. Intervention(s): Breastfeeding basics reviewed;Skin to skin;Position options  LATCH Score: 9  Lactation Tools Discussed/Used     Consult Status Consult Status: Follow-up Date: 06/23/15 Follow-up type: In-patient    Geralynn OchsWILLIARD, Desirie Minteer 06/22/2015, 4:18 PM

## 2015-06-23 ENCOUNTER — Ambulatory Visit: Payer: Self-pay

## 2015-06-23 ENCOUNTER — Inpatient Hospital Stay (HOSPITAL_COMMUNITY): Admission: RE | Admit: 2015-06-23 | Payer: Managed Care, Other (non HMO) | Source: Ambulatory Visit

## 2015-06-23 LAB — CBC
HCT: 38.4 % (ref 36.0–46.0)
Hemoglobin: 12.7 g/dL (ref 12.0–15.0)
MCH: 29.2 pg (ref 26.0–34.0)
MCHC: 33.1 g/dL (ref 30.0–36.0)
MCV: 88.3 fL (ref 78.0–100.0)
PLATELETS: 182 10*3/uL (ref 150–400)
RBC: 4.35 MIL/uL (ref 3.87–5.11)
RDW: 14.5 % (ref 11.5–15.5)
WBC: 7.2 10*3/uL (ref 4.0–10.5)

## 2015-06-23 MED ORDER — IBUPROFEN 600 MG PO TABS: 600.0000 mg | ORAL_TABLET | Freq: Four times a day (QID) | ORAL | Status: AC

## 2015-06-23 NOTE — Progress Notes (Signed)
Post Partum Day 1 Subjective: no complaints, up ad lib, voiding and tolerating PO.  Requests early discharge.  Objective: Blood pressure 115/80, pulse 76, temperature 98.1 F (36.7 C), temperature source Oral, resp. rate 20, height 5\' 4"  (1.626 m), weight 182 lb (82.555 kg), SpO2 99 %, unknown if currently breastfeeding.  Physical Exam:  General: alert, cooperative and appears stated age Lochia: appropriate Uterine Fundus: firm Incision: healing well, no significant drainage, no dehiscence DVT Evaluation: No evidence of DVT seen on physical exam. Negative Homan's sign. No cords or calf tenderness.   Recent Labs  06/21/15 2205 06/23/15 0530  HGB 12.3 12.7  HCT 36.7 38.4    Assessment/Plan: Discharge home and Breastfeeding   LOS: 2 days   Tammy Foster 06/23/2015, 8:19 AM

## 2015-06-23 NOTE — Discharge Instructions (Signed)
Call MD for T>100.4, heavy vaginal bleeding, or respiratory distress.  Call office to schedule postpartum visit in 4-6 weeks.  Pelvic rest x 6 weeks.   °

## 2015-06-23 NOTE — Lactation Note (Signed)
This note was copied from a baby's chart. Lactation Consultation Note  Patient Name: Tammy Foster ONGEX'BToday's Date: 06/23/2015 Reason for consult: Follow-up assessment Mom reports baby is nursing well, denies questions/concerns. Engorgement care reviewed if needed. Advised of OP services and support group. Encouraged to call as needed.   Maternal Data    Feeding Feeding Type: Breast Fed Length of feed: 40 min  LATCH Score/Interventions                      Lactation Tools Discussed/Used     Consult Status Consult Status: Complete Date: 06/23/15 Follow-up type: In-patient    Alfred LevinsGranger, [name] 06/23/2015, 1:41 PM

## 2015-06-23 NOTE — Discharge Summary (Signed)
Obstetric Discharge Summary Reason for Admission: onset of labor Prenatal Procedures: none Intrapartum Procedures: spontaneous vaginal delivery Postpartum Procedures: none Complications-Operative and Postpartum: 1st degree perineal laceration HEMOGLOBIN  Date Value Ref Range Status  06/23/2015 12.7 12.0 - 15.0 g/dL Final   HCT  Date Value Ref Range Status  06/23/2015 38.4 36.0 - 46.0 % Final    Physical Exam:  General: alert, cooperative and appears stated age Lochia: appropriate Uterine Fundus: firm Incision: healing well, no significant drainage, no dehiscence DVT Evaluation: No evidence of DVT seen on physical exam. Negative Homan's sign. No cords or calf tenderness.  Discharge Diagnoses: Term Pregnancy-delivered  Discharge Information: Date: 06/23/2015 Activity: pelvic rest Diet: routine Medications: PNV and Ibuprofen Condition: stable Instructions: refer to practice specific booklet Discharge to: home   Newborn Data: Live born female  Birth Weight: 8 lb 5 oz (3771 g) APGAR: 9, 9  Home with mother.  Tammy Foster 06/23/2015, 8:22 AM

## 2015-09-11 ENCOUNTER — Ambulatory Visit (HOSPITAL_COMMUNITY): Admission: RE | Admit: 2015-09-11 | Payer: Managed Care, Other (non HMO) | Source: Ambulatory Visit

## 2015-09-11 ENCOUNTER — Ambulatory Visit (HOSPITAL_COMMUNITY)
Admission: RE | Admit: 2015-09-11 | Discharge: 2015-09-11 | Disposition: A | Discharge status: Home / Self Care | Payer: Managed Care, Other (non HMO) | Source: Ambulatory Visit | Attending: Obstetrics and Gynecology | Admitting: Obstetrics and Gynecology

## 2015-09-11 ENCOUNTER — Other Ambulatory Visit (HOSPITAL_COMMUNITY): Payer: Self-pay | Admitting: Obstetrics and Gynecology

## 2015-09-11 ENCOUNTER — Encounter (HOSPITAL_COMMUNITY): Payer: Self-pay | Admitting: Obstetrics and Gynecology

## 2015-09-11 DIAGNOSIS — T8332XS Displacement of intrauterine contraceptive device, sequela: Secondary | ICD-10-CM

## 2015-09-11 DIAGNOSIS — Z30431 Encounter for routine checking of intrauterine contraceptive device: Secondary | ICD-10-CM | POA: Insufficient documentation

## 2015-09-11 LAB — PREGNANCY, URINE: PREG TEST UR: NEGATIVE

## 2015-09-14 NOTE — H&P (Signed)
Tammy Foster is an 35 y.o. female who had Mirena IUD placed on 7/14 by NP.  She returned one month later for string check and no strings were visible.  Ultrasound showed no intrauterine IUD.  Xray showed IUD in right deep pelvis.    Pertinent Gynecological History: Menses: n/a Bleeding: none Contraception: abstinence DES exposure: unknown Blood transfusions: none Sexually transmitted diseases: no past history Previous GYN Procedures: DNC  Last mammogram: n/a Date:n/a Last pap: normal Date: 08/2015 OB History: G3, P2   Menstrual History: Menarche age: n/a No LMP recorded.    Past Medical History:  Diagnosis Date  . Abnormal Pap smear   . Asthma   . Headache(784.0)   . Hx of varicella   . Hx of varicella     Past Surgical History:  Procedure Laterality Date  . DILATION AND CURETTAGE OF UTERUS     endometrial polyp  . MOUTH SURGERY      Family History  Problem Relation Age of Onset  . Adopted: Yes    Social History:  reports that she has never smoked. She has never used smokeless tobacco. She reports that she does not drink alcohol or use drugs.  Allergies:  Allergies  Allergen Reactions  . Neomycin Other (See Comments)    Reaction as child. "severe burn like reaction to skin"    No prescriptions prior to admission.    ROS  unknown if currently breastfeeding. Physical Exam  Constitutional: She is oriented to person, place, and time. She appears well-developed and well-nourished.  GI: Soft. There is no rebound and no guarding.  Neurological: She is alert and oriented to person, place, and time.  Skin: Skin is warm and dry.  Psychiatric: She has a normal mood and affect. Her behavior is normal.    No results found for this or any previous visit (from the past 24 hour(s)).  No results found.  Assessment/Plan: 35yo G3P2 with extrauterine IUD -Laparoscopic removal of IUD -Patient is counseled re: risk of bleeding, infection, scarring, and damage to  surrounding structures.  All questions were answered and the patient wishes to proceed.  Zalyn Amend 09/14/2015, 4:38 PM

## 2015-09-15 ENCOUNTER — Encounter (HOSPITAL_COMMUNITY): Payer: Self-pay

## 2015-09-15 ENCOUNTER — Encounter (HOSPITAL_COMMUNITY)
Admission: RE | Admit: 2015-09-15 | Discharge: 2015-09-15 | Disposition: A | Discharge status: Home / Self Care | Payer: Managed Care, Other (non HMO) | Source: Ambulatory Visit | Attending: Obstetrics & Gynecology | Admitting: Obstetrics & Gynecology

## 2015-09-15 DIAGNOSIS — T8332XA Displacement of intrauterine contraceptive device, initial encounter: Secondary | ICD-10-CM | POA: Diagnosis present

## 2015-09-15 LAB — TYPE AND SCREEN
ABO/RH(D): O POS
ANTIBODY SCREEN: NEGATIVE

## 2015-09-15 LAB — CBC
HEMATOCRIT: 39.5 % (ref 36.0–46.0)
Hemoglobin: 13.8 g/dL (ref 12.0–15.0)
MCH: 30.4 pg (ref 26.0–34.0)
MCHC: 34.9 g/dL (ref 30.0–36.0)
MCV: 87 fL (ref 78.0–100.0)
PLATELETS: 282 10*3/uL (ref 150–400)
RBC: 4.54 MIL/uL (ref 3.87–5.11)
RDW: 13.3 % (ref 11.5–15.5)
WBC: 5.3 10*3/uL (ref 4.0–10.5)

## 2015-09-15 NOTE — Patient Instructions (Addendum)
Your procedure is scheduled on: Tuesday, 8/15  [facility]  Pick up the phone at the desk and dial [phone].  Call this number if you have problems the morning of surgery: [phone].  Remember: Do NOT eat food or drink after midnight tonight 8/14  Take these medicines the morning of surgery with a SIP OF WATER:  None  Do NOT wear jewelry (body piercing), metal hair clips/bobby pins, make-up, or nail polish.   Do NOT wear lotions, powders, or perfumes.  You may wear deoderant.  Do NOT shave for 48 hours prior to surgery.  Do NOT bring valuables to the hospital.   Have a responsible adult drive you home and stay with you for 24 hours after your procedure.  Home with husband Tammy Foster cell [phone].

## 2015-09-15 NOTE — Anesthesia Preprocedure Evaluation (Addendum)
Anesthesia Evaluation  Patient identified by MRN, date of birth, ID band Patient awake    Reviewed: Allergy & Precautions, NPO status , Patient's Chart, lab work & pertinent test results  History of Anesthesia Complications Negative for: history of anesthetic complications  Airway Mallampati: III  TM Distance: >3 FB Neck ROM: Full    Dental  (+) Dental Advisory Given, Teeth Intact   Pulmonary neg shortness of breath, asthma (as a child) , neg sleep apnea, neg COPD, neg recent URI,    Pulmonary exam normal breath sounds clear to auscultation       Cardiovascular Exercise Tolerance: Good (-) anginanegative cardio ROS   Rhythm:Regular Rate:Normal     Neuro/Psych  Headaches, neg Seizures    GI/Hepatic negative GI ROS, Neg liver ROS,   Endo/Other  negative endocrine ROS  Renal/GU negative Renal ROS     Musculoskeletal   Abdominal (+) - obese,   Peds  Hematology negative hematology ROS (+)   Anesthesia Other Findings   Reproductive/Obstetrics                            Anesthesia Physical Anesthesia Plan  ASA: II  Anesthesia Plan: General   Post-op Pain Management:    Induction: Intravenous  Airway Management Planned: Oral ETT  Additional Equipment:   Intra-op Plan:   Post-operative Plan: Extubation in OR  Informed Consent: I have reviewed the patients History and Physical, chart, labs and discussed the procedure including the risks, benefits and alternatives for the proposed anesthesia with the patient or authorized representative who has indicated his/her understanding and acceptance.   Dental advisory given  Plan Discussed with:   Anesthesia Plan Comments:        Anesthesia Quick Evaluation

## 2015-09-16 ENCOUNTER — Encounter (HOSPITAL_COMMUNITY)
Admission: RE | Disposition: A | Discharge status: Home / Self Care | Payer: Self-pay | Source: Ambulatory Visit | Attending: Obstetrics & Gynecology

## 2015-09-16 ENCOUNTER — Ambulatory Visit (HOSPITAL_COMMUNITY): Payer: Managed Care, Other (non HMO) | Admitting: Anesthesiology

## 2015-09-16 ENCOUNTER — Ambulatory Visit (HOSPITAL_COMMUNITY)
Admission: RE | Admit: 2015-09-16 | Discharge: 2015-09-16 | Disposition: A | Discharge status: Home / Self Care | Payer: Managed Care, Other (non HMO) | Source: Ambulatory Visit | Attending: Obstetrics & Gynecology | Admitting: Obstetrics & Gynecology

## 2015-09-16 ENCOUNTER — Encounter (HOSPITAL_COMMUNITY): Payer: Self-pay

## 2015-09-16 DIAGNOSIS — T8332XA Displacement of intrauterine contraceptive device, initial encounter: Secondary | ICD-10-CM | POA: Diagnosis not present

## 2015-09-16 HISTORY — PX: LAPAROSCOPY: SHX197

## 2015-09-16 SURGERY — LAPAROSCOPY, DIAGNOSTIC
Anesthesia: General

## 2015-09-16 MED ORDER — PROPOFOL 10 MG/ML IV BOLUS
INTRAVENOUS | Status: AC
  Filled 2015-09-16: qty 20

## 2015-09-16 MED ORDER — NEOSTIGMINE METHYLSULFATE 10 MG/10ML IV SOLN
INTRAVENOUS | Status: DC | PRN
  Administered 2015-09-16: 4 mg via INTRAVENOUS

## 2015-09-16 MED ORDER — NEOSTIGMINE METHYLSULFATE 10 MG/10ML IV SOLN
INTRAVENOUS | Status: AC
  Filled 2015-09-16: qty 1

## 2015-09-16 MED ORDER — FENTANYL CITRATE (PF) 100 MCG/2ML IJ SOLN: 25.0000 ug | INTRAMUSCULAR | Status: DC | PRN

## 2015-09-16 MED ORDER — GLYCOPYRROLATE 0.2 MG/ML IJ SOLN
INTRAMUSCULAR | Status: AC
  Filled 2015-09-16: qty 3

## 2015-09-16 MED ORDER — DEXAMETHASONE SODIUM PHOSPHATE 4 MG/ML IJ SOLN
INTRAMUSCULAR | Status: AC
  Filled 2015-09-16: qty 1

## 2015-09-16 MED ORDER — ONDANSETRON HCL 4 MG/2ML IJ SOLN
INTRAMUSCULAR | Status: DC | PRN
  Administered 2015-09-16: 4 mg via INTRAVENOUS

## 2015-09-16 MED ORDER — LIDOCAINE HCL (CARDIAC) 20 MG/ML IV SOLN
INTRAVENOUS | Status: AC
  Filled 2015-09-16: qty 5

## 2015-09-16 MED ORDER — CEFAZOLIN SODIUM-DEXTROSE 2-4 GM/100ML-% IV SOLN
2.0000 g | INTRAVENOUS | Status: AC
  Administered 2015-09-16: 2 g via INTRAVENOUS

## 2015-09-16 MED ORDER — LACTATED RINGERS IV SOLN
INTRAVENOUS | Status: DC
  Administered 2015-09-16: 125 mL/h via INTRAVENOUS
  Administered 2015-09-16: 08:00:00 via INTRAVENOUS

## 2015-09-16 MED ORDER — DEXAMETHASONE SODIUM PHOSPHATE 10 MG/ML IJ SOLN
INTRAMUSCULAR | Status: DC | PRN
  Administered 2015-09-16: 4 mg via INTRAVENOUS

## 2015-09-16 MED ORDER — PROMETHAZINE HCL 25 MG/ML IJ SOLN
6.2500 mg | INTRAMUSCULAR | Status: DC | PRN
Start: 2015-09-16 — End: 2015-09-16

## 2015-09-16 MED ORDER — LIDOCAINE HCL (CARDIAC) 20 MG/ML IV SOLN
INTRAVENOUS | Status: DC | PRN
  Administered 2015-09-16: 80 mg via INTRAVENOUS

## 2015-09-16 MED ORDER — MIDAZOLAM HCL 2 MG/2ML IJ SOLN
INTRAMUSCULAR | Status: AC
  Filled 2015-09-16: qty 2

## 2015-09-16 MED ORDER — EPHEDRINE SULFATE 50 MG/ML IJ SOLN
INTRAMUSCULAR | Status: DC | PRN
Start: 2015-09-16 — End: 2015-09-16
  Administered 2015-09-16: 5 mg via INTRAVENOUS

## 2015-09-16 MED ORDER — SODIUM CHLORIDE 0.9 % IJ SOLN
INTRAMUSCULAR | Status: AC
  Filled 2015-09-16: qty 10

## 2015-09-16 MED ORDER — FENTANYL CITRATE (PF) 100 MCG/2ML IJ SOLN
INTRAMUSCULAR | Status: DC | PRN
  Administered 2015-09-16 (×3): 100 ug via INTRAVENOUS
  Administered 2015-09-16: 50 ug via INTRAVENOUS

## 2015-09-16 MED ORDER — ROCURONIUM BROMIDE 100 MG/10ML IV SOLN
INTRAVENOUS | Status: DC | PRN
  Administered 2015-09-16: 30 mg via INTRAVENOUS

## 2015-09-16 MED ORDER — OXYCODONE-ACETAMINOPHEN 5-325 MG PO TABS: 1.0000 | ORAL_TABLET | ORAL | 0 refills | Status: AC | PRN

## 2015-09-16 MED ORDER — KETOROLAC TROMETHAMINE 30 MG/ML IJ SOLN
INTRAMUSCULAR | Status: AC
  Filled 2015-09-16: qty 1

## 2015-09-16 MED ORDER — ROCURONIUM BROMIDE 100 MG/10ML IV SOLN
INTRAVENOUS | Status: AC
  Filled 2015-09-16: qty 1

## 2015-09-16 MED ORDER — PROPOFOL 10 MG/ML IV BOLUS
INTRAVENOUS | Status: DC | PRN
  Administered 2015-09-16: 150 mg via INTRAVENOUS

## 2015-09-16 MED ORDER — BUPIVACAINE HCL (PF) 0.25 % IJ SOLN
INTRAMUSCULAR | Status: AC
  Filled 2015-09-16: qty 30

## 2015-09-16 MED ORDER — SCOPOLAMINE 1 MG/3DAYS TD PT72
MEDICATED_PATCH | TRANSDERMAL | Status: AC
  Administered 2015-09-16: 1.5 mg via TRANSDERMAL
  Filled 2015-09-16: qty 1

## 2015-09-16 MED ORDER — FENTANYL CITRATE (PF) 250 MCG/5ML IJ SOLN
INTRAMUSCULAR | Status: AC
  Filled 2015-09-16: qty 5

## 2015-09-16 MED ORDER — EPHEDRINE 5 MG/ML INJ
INTRAVENOUS | Status: AC
  Filled 2015-09-16: qty 10

## 2015-09-16 MED ORDER — MIDAZOLAM HCL 2 MG/2ML IJ SOLN
INTRAMUSCULAR | Status: DC | PRN
  Administered 2015-09-16: 2 mg via INTRAVENOUS

## 2015-09-16 MED ORDER — BUPIVACAINE HCL (PF) 0.25 % IJ SOLN
INTRAMUSCULAR | Status: DC | PRN
  Administered 2015-09-16: 3 mL

## 2015-09-16 MED ORDER — GLYCOPYRROLATE 0.2 MG/ML IJ SOLN
INTRAMUSCULAR | Status: DC | PRN
  Administered 2015-09-16: 0.6 mg via INTRAVENOUS

## 2015-09-16 MED ORDER — SCOPOLAMINE 1 MG/3DAYS TD PT72
1.0000 | MEDICATED_PATCH | Freq: Once | TRANSDERMAL | Status: DC
  Administered 2015-09-16: 1.5 mg via TRANSDERMAL

## 2015-09-16 MED ORDER — SODIUM CHLORIDE 0.9 % IJ SOLN
INTRAMUSCULAR | Status: DC | PRN
  Administered 2015-09-16: 10 mL

## 2015-09-16 MED ORDER — FENTANYL CITRATE (PF) 100 MCG/2ML IJ SOLN
INTRAMUSCULAR | Status: AC
  Filled 2015-09-16: qty 2

## 2015-09-16 SURGICAL SUPPLY — 31 items
BARRIER ADHS 3X4 INTERCEED (GAUZE/BANDAGES/DRESSINGS) IMPLANT
BENZOIN TINCTURE PRP APPL 2/3 (GAUZE/BANDAGES/DRESSINGS) ×3 IMPLANT
CABLE HIGH FREQUENCY MONO STRZ (ELECTRODE) IMPLANT
CATH ROBINSON RED A/P 16FR (CATHETERS) ×3 IMPLANT
CLOSURE WOUND 1/4 X3 (GAUZE/BANDAGES/DRESSINGS)
CLOTH BEACON ORANGE TIMEOUT ST (SAFETY) ×3 IMPLANT
DRSG COVADERM PLUS 2X2 (GAUZE/BANDAGES/DRESSINGS) IMPLANT
DRSG OPSITE POSTOP 3X4 (GAUZE/BANDAGES/DRESSINGS) IMPLANT
DURAPREP 26ML APPLICATOR (WOUND CARE) ×3 IMPLANT
FORCEPS CUTTING 33CM 5MM (CUTTING FORCEPS) IMPLANT
GLOVE BIO SURGEON STRL SZ 6 (GLOVE) ×3 IMPLANT
GLOVE BIOGEL PI IND STRL 6 (GLOVE) ×2 IMPLANT
GLOVE BIOGEL PI IND STRL 7.0 (GLOVE) ×1 IMPLANT
GLOVE BIOGEL PI INDICATOR 6 (GLOVE) ×4
GLOVE BIOGEL PI INDICATOR 7.0 (GLOVE) ×2
GOWN STRL REUS W/TWL LRG LVL3 (GOWN DISPOSABLE) ×9 IMPLANT
NEEDLE INSUFFLATION 120MM (ENDOMECHANICALS) ×3 IMPLANT
NS IRRIG 1000ML POUR BTL (IV SOLUTION) IMPLANT
PACK LAPAROSCOPY BASIN (CUSTOM PROCEDURE TRAY) ×3 IMPLANT
PAD TRENDELENBURG POSITION (MISCELLANEOUS) ×3 IMPLANT
POUCH SPECIMEN RETRIEVAL 10MM (ENDOMECHANICALS) IMPLANT
SET IRRIG TUBING LAPAROSCOPIC (IRRIGATION / IRRIGATOR) IMPLANT
SLEEVE XCEL OPT CAN 5 100 (ENDOMECHANICALS) IMPLANT
STRIP CLOSURE SKIN 1/4X3 (GAUZE/BANDAGES/DRESSINGS) IMPLANT
SUT MNCRL AB 3-0 PS2 27 (SUTURE) ×3 IMPLANT
SUT VICRYL 0 UR6 27IN ABS (SUTURE) ×3 IMPLANT
TOWEL OR 17X24 6PK STRL BLUE (TOWEL DISPOSABLE) ×6 IMPLANT
TROCAR XCEL NON-BLD 11X100MML (ENDOMECHANICALS) ×3 IMPLANT
TROCAR XCEL NON-BLD 5MMX100MML (ENDOMECHANICALS) ×3 IMPLANT
WARMER LAPAROSCOPE (MISCELLANEOUS) ×3 IMPLANT
WATER STERILE IRR 1000ML POUR (IV SOLUTION) ×3 IMPLANT

## 2015-09-16 NOTE — Discharge Instructions (Signed)

## 2015-09-16 NOTE — Progress Notes (Signed)
No change to H&P.  Michoel Kunin, DO 

## 2015-09-16 NOTE — Op Note (Signed)
Tammy CaoKristina Foster 09/16/2015  PREOPERATIVE DIAGNOSIS:  Extrauterine Mirena IUD  POSTOPERATIVE DIAGNOSIS:  SAA  PROCEDURE:  Diagnostic laparoscopy with removal of Mirena IUD  ANESTHESIA:  General endotracheal  COMPLICATIONS:  None immediate.  ESTIMATED BLOOD LOSS:  Less than 20 ml.  INDICATIONS: 35 y.o. with extrauterine IUD confirmed on Xray.      FINDINGS:  IUD in right side of posterior culdesac.    TECHNIQUE:  The patient was taken to the operating room where general anesthesia was obtained without difficulty.  2 g Ancef were given. She was then placed in the dorsal lithotomy position and prepared and draped in sterile fashion.  After an adequate timeout was performed, a bivalved speculum was then placed in the patient's vagina, and the anterior lip of cervix grasped with the single-tooth tenaculum.  The hulka clip was advanced into the uterus.  The speculum was removed from the vagina.  Attention was then turned to the patient's abdomen where a 10-mm skin incision was made on the umbilical fold.  The Veress needle was carefully introduced into the peritoneal cavity through the abdominal wall.  Intraperitoneal placement was confirmed by drop in intraabdominal pressure with insufflation of carbon dioxide gas.  Adequate pneumoperitoneum was obtained, and the 10/11 XL trocar was then advanced without difficulty into the abdomen where intraabdominal placement was confirmed by the operative laparoscope.  The above listed findings were noted.  Using an atraumatic grasper, the IUD was removed through the operative laparoscope.  All instruments were removed from the abdomen.  The fascial incision was closed with 0 Vicryl in a figure of eight stitch.  The skin incision was closed with 2-0 Monocryl in a subcuticular fashion.    The uterine manipulator and the tenaculum were removed from the vagina without complications. The patient tolerated the procedure well.  Sponge, lap, and needle counts  were correct times two.  The patient was then taken to the recovery room awake, extubated and in stable  in stable condition.

## 2015-09-16 NOTE — Transfer of Care (Signed)
Immediate Anesthesia Transfer of Care Note  Patient: Barbaraann CaoKristina Schild  Procedure(s) Performed: Procedure(s): LAPAROSCOPY DIAGNOSTIC with removal of IUD (N/A)  Patient Location: PACU  Anesthesia Type:General  Level of Consciousness: awake, alert  and oriented  Airway & Oxygen Therapy: Patient Spontanous Breathing  Post-op Assessment: Report given to RN and Post -op Vital signs reviewed and stable  Post vital signs: Reviewed and stable  Last Vitals:  Vitals:   09/16/15 0557  BP: 117/80  Pulse: 67  Resp: 18  Temp: 36.8 C    Last Pain:  Vitals:   09/16/15 0557  TempSrc: Oral      Patients Stated Pain Goal: 3 (09/16/15 0557)  Complications: No apparent anesthesia complications

## 2015-09-16 NOTE — Anesthesia Postprocedure Evaluation (Signed)
Anesthesia Post Note  Patient: Barbaraann CaoKristina Axe  Procedure(s) Performed: Procedure(s) (LRB): LAPAROSCOPY DIAGNOSTIC with removal of IUD (N/A)  Patient location during evaluation: PACU Anesthesia Type: General Level of consciousness: awake and alert Pain management: pain level controlled Vital Signs Assessment: post-procedure vital signs reviewed and stable Respiratory status: spontaneous breathing, nonlabored ventilation and respiratory function stable Cardiovascular status: blood pressure returned to baseline and stable Postop Assessment: no signs of nausea or vomiting Anesthetic complications: no     Last Vitals:  Vitals:   09/16/15 0945 09/16/15 0949  BP: 115/77   Pulse: 68 78  Resp: 13 19  Temp:  36.8 C    Last Pain:  Vitals:   09/16/15 0949  TempSrc: Oral   Pain Goal: Patients Stated Pain Goal: 3 (09/16/15 0557)               Linton RumpJennifer Dickerson Annemarie Sebree

## 2015-09-17 ENCOUNTER — Encounter (HOSPITAL_COMMUNITY): Payer: Self-pay | Admitting: Obstetrics & Gynecology

## 2017-08-03 IMAGING — CR DG ABDOMEN 1V
1 series · 1 of 1 positions shown · non-contrast
Comparison: None.

CLINICAL DATA: 34-year-old female with history of IUD displacement,
with no IUD detected on outside office sonogram.

EXAM:
ABDOMEN - 1 VIEW

[abdomen kub]
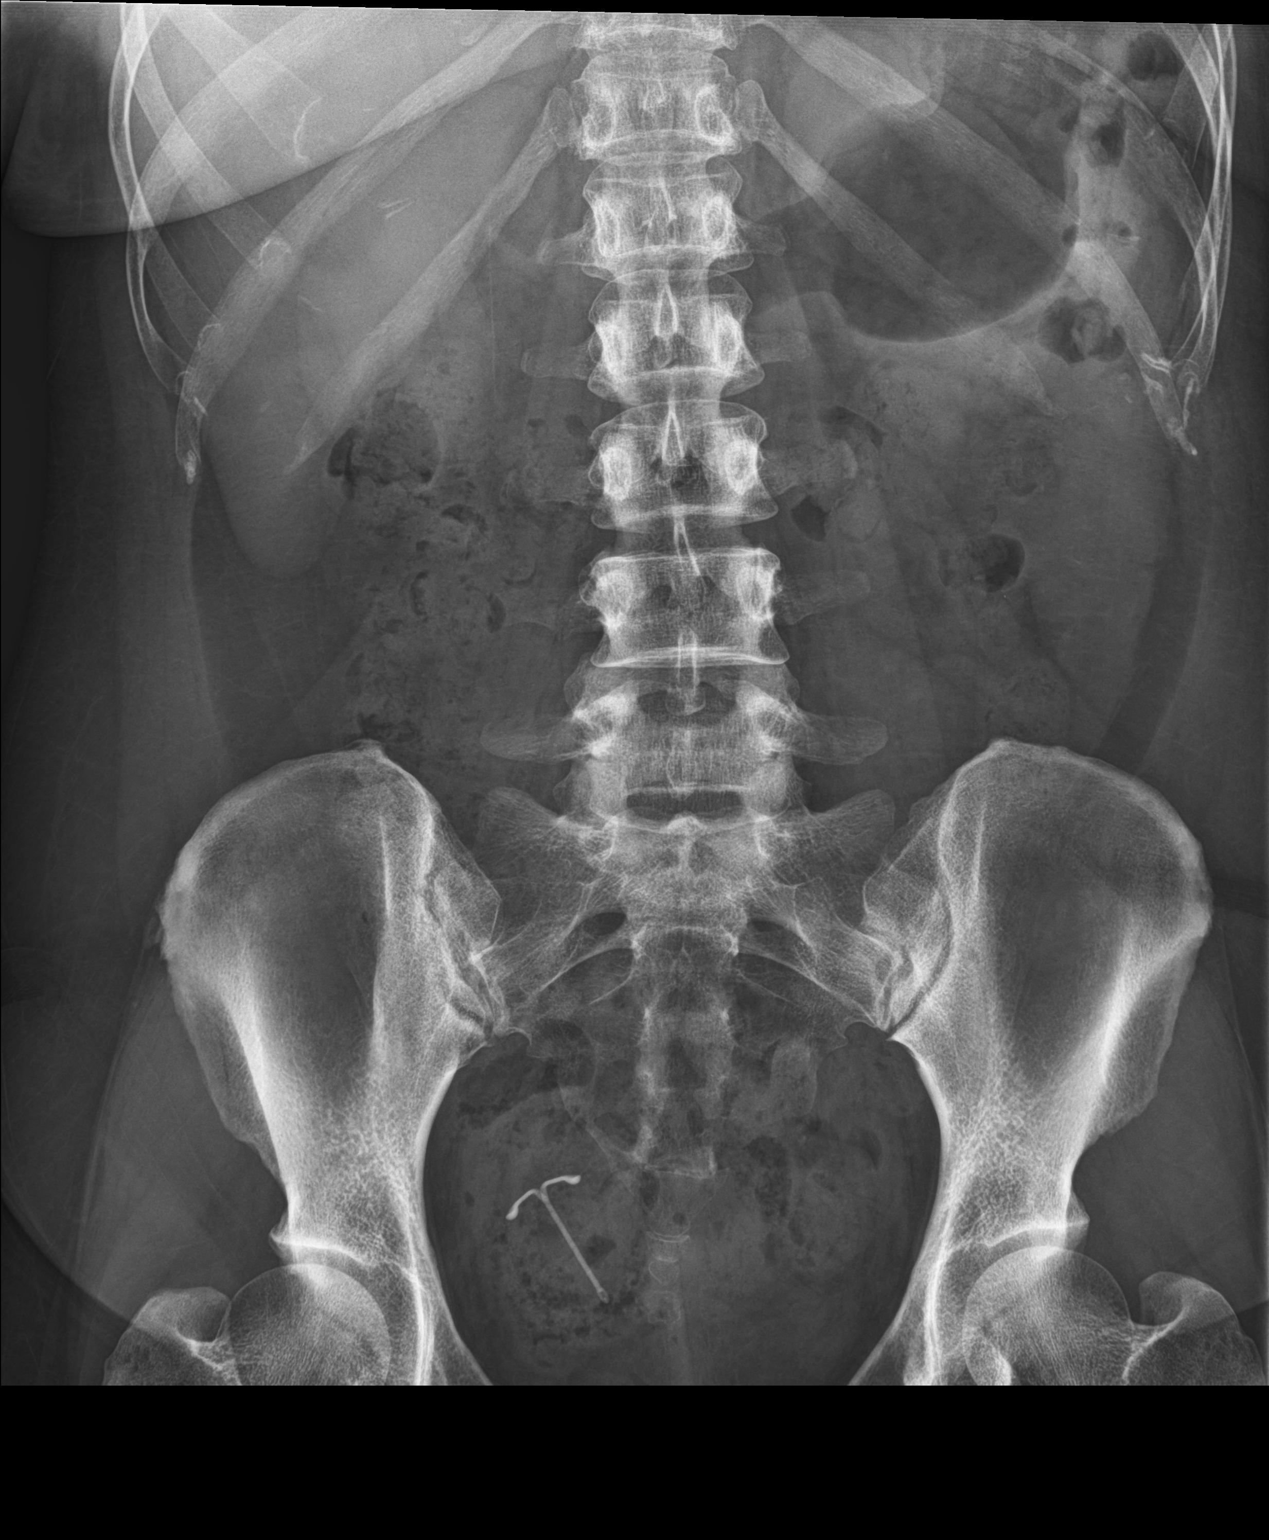

[1 of 1 positions shown; findings below may reference images not displayed]

FINDINGS: Intact appearing intrauterine device is located in the right deep
pelvis to the right of the coccyx. No dilated small bowel loops.
Mild to moderate colorectal stool volume. No evidence of pneumatosis
or pneumoperitoneum. No pathologic soft tissue calcifications.
Visualized osseous structures appear intact.
IMPRESSION: Intact appearing IUD is located in the right deep pelvis.
Nonobstructive bowel gas pattern with no evidence of pneumatosis or
pneumoperitoneum.

These results were called by telephone at the time of interpretation
on 09/11/2015 at [DATE] to NP IZURU DANUSHKA , who verbally
acknowledged these results.

## 2019-05-14 ENCOUNTER — Ambulatory Visit: Admit: 2019-05-14 | Payer: PRIVATE HEALTH INSURANCE | Primary: Internal Medicine

## 2019-05-15 DIAGNOSIS — Z23 Encounter for immunization: Secondary | ICD-10-CM

## 2019-06-04 ENCOUNTER — Ambulatory Visit: Admit: 2019-06-04 | Payer: PRIVATE HEALTH INSURANCE | Primary: Internal Medicine

## 2019-06-04 DIAGNOSIS — Z23 Encounter for immunization: Secondary | ICD-10-CM

## 2019-09-16 ENCOUNTER — Emergency Department: Admit: 2019-09-16 | Payer: PRIVATE HEALTH INSURANCE | Primary: Internal Medicine

## 2019-09-16 ENCOUNTER — Inpatient Hospital Stay: Admit: 2019-09-16 | Discharge: 2019-09-16 | Payer: BLUE CROSS/BLUE SHIELD | Primary: Internal Medicine

## 2019-09-16 DIAGNOSIS — R1013 Epigastric pain: Secondary | ICD-10-CM

## 2019-09-16 DIAGNOSIS — K8020 Calculus of gallbladder without cholecystitis without obstruction: Secondary | ICD-10-CM

## 2019-09-16 DIAGNOSIS — R112 Nausea with vomiting, unspecified: Principal | ICD-10-CM

## 2019-09-16 DIAGNOSIS — Z881 Allergy status to other antibiotic agents status: Secondary | ICD-10-CM

## 2019-09-16 LAB — COMPREHENSIVE METABOLIC PANEL
BKR A/G RATIO: 1.4 (ref 1.0–2.2)
BKR ALANINE AMINOTRANSFERASE (ALT): 59 U/L — ABNORMAL HIGH (ref 10–35)
BKR ALBUMIN: 4.6 g/dL (ref 3.6–4.9)
BKR ALKALINE PHOSPHATASE: 61 U/L (ref 9–122)
BKR ANION GAP: 15 x 1000/??L (ref 7–17)
BKR ASPARTATE AMINOTRANSFERASE (AST): 37 U/L — ABNORMAL HIGH (ref 10–35)
BKR AST/ALT RATIO: 0.6
BKR BILIRUBIN TOTAL: 0.4 mg/dL (ref <=1.2)
BKR BLOOD UREA NITROGEN: 8 mg/dL (ref 6–20)
BKR BUN / CREAT RATIO: 14.3 (ref 8.0–23.0)
BKR CALCIUM: 9.1 mg/dL (ref 8.8–10.2)
BKR CHLORIDE: 101 mmol/L (ref 98–107)
BKR CO2: 21 mmol/L — ABNORMAL LOW (ref 20–30)
BKR CREATININE: 0.56 mg/dL (ref 0.40–1.30)
BKR EGFR (AFR AMER): >60 mL/min/{1.73_m2} (ref >60)
BKR EGFR (NON AFRICAN AMERICAN): >60 mL/min/{1.73_m2} (ref >60)
BKR GLOBULIN: 3.3 g/dL
BKR GLUCOSE: 116 mg/dL — ABNORMAL HIGH (ref 70–100)
BKR POTASSIUM: 4 mmol/L (ref 3.3–5.1)
BKR PROTEIN TOTAL: 7.9 g/dL (ref 6.6–8.7)
BKR SODIUM: 137 mmol/L (ref 136–144)
BKR WAM BASOPHILS: 14.3 % (ref 8.0–23.0)

## 2019-09-16 LAB — CBC WITH AUTO DIFFERENTIAL
BKR WAM ABSOLUTE IMMATURE GRANULOCYTES: 0.1 x 1000/ÂµL (ref 0.0–0.4)
BKR WAM ABSOLUTE LYMPHOCYTE COUNT: 1.3 x 1000/ÂµL (ref 0.5–5.4)
BKR WAM ABSOLUTE NRBC: 0 x 1000/??L (ref >60)
BKR WAM ANALYZER ANC: 8.8 x 1000/ÂµL — ABNORMAL HIGH (ref 2.2–7.2)
BKR WAM BASOPHIL ABSOLUTE COUNT: 0 x 1000/ÂµL (ref 0.0–0.2)
BKR WAM BASOPHILS: 0.3 % (ref 0.0–2.0)
BKR WAM EOSINOPHIL ABSOLUTE COUNT: 0 x 1000/ÂµL (ref 0.0–0.4)
BKR WAM EOSINOPHILS: 0 % (ref 0.0–4.0)
BKR WAM HEMATOCRIT: 48.8 % — ABNORMAL HIGH (ref 36.0–48.0)
BKR WAM HEMOGLOBIN: 15.7 g/dL — ABNORMAL HIGH (ref 12.0–15.0)
BKR WAM IMMATURE GRANULOCYTES: 0.5 % — ABNORMAL HIGH (ref 0.0–0.4)
BKR WAM LYMPHOCYTES: 12.1 % (ref 10.0–50.0)
BKR WAM MCH (PG): 28.7 pg (ref 25.0–35.0)
BKR WAM MCHC: 32.2 g/dL — ABNORMAL LOW (ref 33.0–37.0)
BKR WAM MCV: 89.2 fL (ref 81.0–99.0)
BKR WAM MONOCYTES: 3.6 % (ref 3.0–11.0)
BKR WAM MPV: 10.5 fL (ref 8.0–12.0)
BKR WAM NEUTROPHILS: 83.5 % (ref 45.0–90.0)
BKR WAM NUCLEATED RED BLOOD CELLS: 0 % — AB (ref 0.0–0.0)
BKR WAM PLATELETS: 299 x1000/??L — ABNORMAL HIGH (ref 120–450)
BKR WAM RDW-CV: 12.2 % (ref 11.5–14.5)
BKR WAM RED BLOOD CELL COUNT: 5.5 M/??L (ref 3.5–5.5)
BKR WAM WHITE BLOOD CELL COUNT: 10.6 x1000/??L — ABNORMAL LOW (ref 4.8–10.8)

## 2019-09-16 LAB — URINE MICROSCOPIC     (BH GH LMW YH)
BKR RBC/HPF INSTRUMENT: 2 /HPF (ref 0–2)
BKR WBC/HPF INSTRUMENT: 3 /HPF — AB (ref 0–5)

## 2019-09-16 LAB — URINALYSIS WITH CULTURE REFLEX      (BH LMW YH)
BKR BILIRUBIN, UA: NEGATIVE
BKR BLOOD, UA: NEGATIVE
BKR LEUKOCYTE ESTERASE, UA: POSITIVE — AB
BKR NITRITE, UA: NEGATIVE /HPF
BKR PH, UA: 7 (ref 5.5–7.5)
BKR SPECIFIC GRAVITY, UA: 1.02 (ref 1.005–1.030)
BKR UROBILINOGEN, UA: <2 EU/dL (ref <=2.0)

## 2019-09-16 LAB — LIPASE: BKR LIPASE: 29 U/L (ref 11–55)

## 2019-09-16 LAB — UA REFLEX CULTURE

## 2019-09-16 MED ORDER — TRAMADOL 50 MG TABLET
50 mg | ORAL_TABLET | Freq: Every evening | ORAL | 1 refills | Status: AC
Start: 2019-09-16 — End: ?

## 2019-09-16 MED ORDER — ALUMINUM-MAG HYDROXIDE-SIMETHICONE 200 MG-200 MG-20 MG/5 ML ORAL SUSP
200-200-20 mg/5 mL | Freq: Before meals | ORAL | Status: DC
Start: 2019-09-16 — End: 2019-09-17
  Administered 2019-09-16: 17:00:00 200-200-20 mL via ORAL

## 2019-09-16 MED ORDER — IBUPROFEN 600 MG TABLET
600 mg | ORAL_TABLET | Freq: Four times a day (QID) | ORAL | 1 refills | Status: AC | PRN
Start: 2019-09-16 — End: ?

## 2019-09-16 MED ORDER — IOHEXOL 350 MG IODINE/ML INTRAVENOUS SOLUTION
350 mg iodine/mL | Freq: Once | INTRAVENOUS | Status: CP | PRN
Start: 2019-09-16 — End: ?
  Administered 2019-09-16: 21:00:00 350 mL via INTRAVENOUS

## 2019-09-16 MED ORDER — ACETAMINOPHEN 325 MG TABLET
325 mg | Freq: Once | ORAL | Status: CP
Start: 2019-09-16 — End: ?
  Administered 2019-09-16: 20:00:00 325 mg via ORAL

## 2019-09-16 MED ORDER — ONDANSETRON 4 MG DISINTEGRATING TABLET
4 mg | ORAL_TABLET | Freq: Three times a day (TID) | 1 refills | Status: AC | PRN
Start: 2019-09-16 — End: ?

## 2019-09-16 MED ORDER — ONDANSETRON HCL (PF) 4 MG/2 ML INJECTION SOLUTION
4 mg/2 mL | Freq: Once | INTRAVENOUS | Status: CP
Start: 2019-09-16 — End: ?
  Administered 2019-09-16: 17:00:00 4 mL via INTRAVENOUS

## 2019-09-16 MED ORDER — LIDOCAINE HCL 2 % MUCOSAL SOLUTION
2 % | Freq: Once | ORAL | Status: CP
Start: 2019-09-16 — End: ?
  Administered 2019-09-16: 14:00:00 2 mL via ORAL

## 2019-09-16 MED ORDER — ACETAMINOPHEN 325 MG TABLET
325 mg | Freq: Once | ORAL | Status: CP
Start: 2019-09-16 — End: ?
  Administered 2019-09-16: 15:00:00 325 mg via ORAL

## 2019-09-16 NOTE — ED Notes
1:07 PM Assumed care of 39 y/o female who presented to ED with abdominal pain with nausea and vomiting from [place], [place]. IV access obtained. Labs collected and sent. Abdominal pain 3/10 at this time.  Medicated per MAR. VSS. Patient to CC pending lab results and provider re-eval.

## 2019-09-16 NOTE — ED Notes
9:18 AM pt with abd pain since last night. Vomited 1 time last night. Mid abd region. Steady abd pain/ rare cramping when pt 'shifts around it feels worse'- pt took a percocet last night without effect.normal BM's. Pt states it feel like abd muscles. can't get comfortable

## 2019-09-18 LAB — URINE CULTURE

## 2019-09-21 NOTE — ED Provider Notes
HistoryChief Complaint Patient presents with ? Abdominal Pain   pt comes to ER with cc abd pain with n/v x 1off and on since 2230 last night ,pt states pain persistant and pt unable to find position that helps relieve pain  ? Nausea ? Emesis  Yesterday evening patient developed periumbilical/epigastric area abdominal pain.  She says it feels like muscle cramp.  She did have an episode of vomiting last night and has some lingering mild nausea.  She denies any change in activity or change in diet.  She denies constipation, genitourinary symptoms.  Menstrual period is due about today.  Surgical history includes laparoscopic IUD removal only.The history is provided by the patient. Abdominal PainAssociated symptoms: nausea and vomiting  Associated symptoms: no constipation, no diarrhea, no fatigue and no fever   No past medical history on file.No past surgical history on file.No family history on file. No existing history information found.No existing history information found.No existing history information found.Review of Systems Constitutional: Negative for activity change, appetite change, fatigue and fever. HENT: Negative.  Respiratory: Negative.  Gastrointestinal: Positive for abdominal pain, nausea and vomiting. Negative for constipation and diarrhea. Genitourinary: Negative.  Musculoskeletal: Negative for back pain. Skin: Negative.   Physical ExamED Triage Vitals [09/16/19 0728]BP: (!) 153/90Pulse: 80Pulse from  O2 sat: n/aResp: 20Temp: 97 ?F (36.1 ?C)Temp src: OralSpO2: 100 % BP 126/88  - Pulse 73  - Temp 98.6 ?F (37 ?C) (Oral)  - Resp 16  - Ht 5' 4 (1.626 m)  - Wt 78.8 kg (173 lb 11.6 oz)  - SpO2 100%  - BMI 29.82 kg/m? Physical ExamConstitutional:     General: She is not in acute distress.   Appearance: She is well-developed. She is not ill-appearing or toxic-appearing. Eyes: Conjunctiva/sclera: Conjunctivae normal. Pulmonary:    Effort: Pulmonary effort is normal. Abdominal:    General: There is no distension.    Palpations: Abdomen is soft. There is no mass.    Tenderness: There is abdominal tenderness. There is no guarding or rebound.    Hernia: No hernia is present.    Comments: Mild epigastric, left upper quadrant, right lower quadrant tenderness.  Negative Murphy sign. Skin:   General: Skin is warm and dry. Neurological:    Mental Status: She is alert.  ProceduresProcedures ED COURSEInterpreted by ED Provider: labsPatient Reevaluation: 1430 - patient had some temporary relief of symptoms but they are returning.  On read exam abdomen remains diffusely tender including the right lower quadrant.  Etiology remains unclear, appendicitis is a possibility, [place] scan is ordered.1615 - signed out to [facility], awaiting [place] results.........6:10 PMIMPRESSION:??Cholelithiasis. Trace pericholecystic fluid is nonspecific in the absence of gallbladder distention. Findings are not definitive for acute cholecystitis though [place] is less sensitive. Clinical correlation is recommended.?Mild wall thickening of pyloric antrum, however no perigastric inflammatory changes are noted. Clinical correlation and endoscopic correlation is recommended.?Reported by:  Shelda Pal, MD?Reported and signed by:  Madie Reno, MDPatient re-evaluated. Pt will be discharged home.  I have advised patient to follow up with Gastroenterology and given strict return precautions.I reviewed today's visit in the ED, with Barbaraann Cao.  Patient will be discharged home to follow up with primary care provider or specialty in 1 to 2 days. Appropriate symptomatic treatments discussed. Indications to return to [facility]  if symptoms worsen or any other concerns. Patient verbalizes understanding. All questions answered. Advised to call if any further questions.Clinical Impressions as of Sep 20 609 Gallstones  ED DispositionDischarge Augustin Coupe, PA08/15/21  1016 Augustin Coupe, PA08/15/21 1444 Augustin Coupe, PA08/15/21 1633 Arvil Chaco, APRN08/15/21 1959 Augustin Coupe, PA08/20/21 [id number]

## 2019-11-22 MED ORDER — CLENPIQ 10 MG-3.5 GRAM-12 GRAM/160 ML ORAL SOLUTION
10 | 1 refills | Status: SS
Start: 2019-11-22 — End: 2019-12-14

## 2019-11-30 ENCOUNTER — Encounter: Admit: 2019-11-30 | Payer: PRIVATE HEALTH INSURANCE | Attending: Family | Primary: Internal Medicine

## 2019-11-30 DIAGNOSIS — Z01818 Encounter for other preprocedural examination: Secondary | ICD-10-CM

## 2019-12-12 ENCOUNTER — Inpatient Hospital Stay: Admit: 2019-12-12 | Discharge: 2019-12-12 | Payer: BLUE CROSS/BLUE SHIELD | Primary: Internal Medicine

## 2019-12-12 DIAGNOSIS — Z20822 Contact with and (suspected) exposure to covid-19: Secondary | ICD-10-CM

## 2019-12-12 DIAGNOSIS — Z01818 Encounter for other preprocedural examination: Secondary | ICD-10-CM

## 2019-12-12 DIAGNOSIS — Z01812 Encounter for preprocedural laboratory examination: Secondary | ICD-10-CM

## 2019-12-12 LAB — COVID-19 CLEARANCE OR FOR PLACEMENT ONLY: BKR SARS-COV-2 RNA (COVID-19) (YH): NEGATIVE

## 2019-12-12 MED ORDER — BIOTIN ORAL
Freq: Every day | ORAL | 0.00 refills | 60.00000 days | Status: AC
Start: 2019-12-12 — End: 2021-12-14

## 2019-12-13 ENCOUNTER — Ambulatory Visit: Admit: 2019-12-13 | Payer: PRIVATE HEALTH INSURANCE | Attending: Certified Registered" | Primary: Internal Medicine

## 2019-12-13 DIAGNOSIS — Z86010 Personal history of colonic polyps: Secondary | ICD-10-CM

## 2019-12-13 DIAGNOSIS — R197 Diarrhea, unspecified: Secondary | ICD-10-CM

## 2019-12-13 DIAGNOSIS — R109 Unspecified abdominal pain: Secondary | ICD-10-CM

## 2019-12-14 ENCOUNTER — Inpatient Hospital Stay: Admit: 2019-12-14 | Discharge: 2019-12-14 | Payer: BLUE CROSS/BLUE SHIELD | Primary: Internal Medicine

## 2019-12-14 ENCOUNTER — Ambulatory Visit: Admit: 2019-12-14 | Payer: PRIVATE HEALTH INSURANCE | Attending: Certified Registered" | Primary: Internal Medicine

## 2019-12-14 ENCOUNTER — Encounter: Admit: 2019-12-14 | Payer: PRIVATE HEALTH INSURANCE | Attending: Gastroenterology | Primary: Internal Medicine

## 2019-12-14 DIAGNOSIS — K8020 Calculus of gallbladder without cholecystitis without obstruction: Secondary | ICD-10-CM

## 2019-12-14 DIAGNOSIS — Z881 Allergy status to other antibiotic agents status: Secondary | ICD-10-CM

## 2019-12-14 DIAGNOSIS — J45909 Unspecified asthma, uncomplicated: Secondary | ICD-10-CM

## 2019-12-14 DIAGNOSIS — Z789 Other specified health status: Secondary | ICD-10-CM

## 2019-12-14 DIAGNOSIS — K3189 Other diseases of stomach and duodenum: Secondary | ICD-10-CM

## 2019-12-14 DIAGNOSIS — K644 Residual hemorrhoidal skin tags: Secondary | ICD-10-CM

## 2019-12-14 DIAGNOSIS — Z86010 Personal history of colonic polyps: Secondary | ICD-10-CM

## 2019-12-14 DIAGNOSIS — Z91011 Allergy to milk products: Secondary | ICD-10-CM

## 2019-12-14 DIAGNOSIS — O039 Complete or unspecified spontaneous abortion without complication: Secondary | ICD-10-CM

## 2019-12-14 DIAGNOSIS — R197 Diarrhea, unspecified: Secondary | ICD-10-CM

## 2019-12-14 DIAGNOSIS — R109 Unspecified abdominal pain: Secondary | ICD-10-CM

## 2019-12-14 MED ORDER — SODIUM CHLORIDE 0.9 % (FLUSH) INJECTION SYRINGE
0.9 % | INTRAVENOUS | Status: DC | PRN
Start: 2019-12-14 — End: 2019-12-14

## 2019-12-14 MED ORDER — SIMETHICONE 40 MG/0.6 ML ORAL DROPS,SUSPENSION
400.6 mg/0.6 mL | Status: DC | PRN
Start: 2019-12-14 — End: 2019-12-14

## 2019-12-14 MED ORDER — SODIUM CHLORIDE 0.9 % (FLUSH) INJECTION SYRINGE
0.9 % | Freq: Three times a day (TID) | INTRAVENOUS | Status: DC
Start: 2019-12-14 — End: 2019-12-14

## 2019-12-14 MED ORDER — PROPOFOL 10 MG/ML INTRAVENOUS EMULSION
10 mg/mL | Status: CP
Start: 2019-12-14 — End: ?

## 2019-12-14 MED ORDER — PROPOFOL 10 MG/ML INTRAVENOUS EMULSION
10 mg/mL | INTRAVENOUS | Status: DC | PRN
Start: 2019-12-14 — End: 2019-12-14
  Administered 2019-12-14: 13:00:00 10 mL/h via INTRAVENOUS

## 2019-12-14 MED ORDER — LACTATED RINGERS INTRAVENOUS SOLUTION
INTRAVENOUS | Status: DC | PRN
Start: 2019-12-14 — End: 2019-12-14
  Administered 2019-12-14: 12:00:00 via INTRAVENOUS

## 2019-12-14 MED ORDER — FENTANYL (PF) 50 MCG/ML INJECTION SOLUTION
50 mcg/mL | Status: CP
Start: 2019-12-14 — End: ?

## 2019-12-14 MED ORDER — LIDOCAINE HCL 10 MG/ML (1 %) INJECTION SOLUTION
10 mg/mL (1 %) | Status: CP
Start: 2019-12-14 — End: ?

## 2019-12-14 MED ORDER — LIDOCAINE (PF) 20 MG/ML (2 %) INJECTION SOLUTION
20 mg/mL (2 %) | INTRAVENOUS | Status: DC | PRN
Start: 2019-12-14 — End: 2019-12-14
  Administered 2019-12-14: 13:00:00 20 mg/mL (2 %) via INTRAVENOUS

## 2019-12-14 MED ORDER — LACTATED RINGERS INTRAVENOUS SOLUTION
INTRAVENOUS | Status: DC
Start: 2019-12-14 — End: 2019-12-14

## 2019-12-14 MED ORDER — ONDANSETRON HCL (PF) 4 MG/2 ML INJECTION SOLUTION
42 mg/2 mL | INTRAVENOUS | Status: DC | PRN
Start: 2019-12-14 — End: 2019-12-14

## 2019-12-14 MED ORDER — PROPOFOL 10 MG/ML INTRAVENOUS EMULSION
10 mg/mL | INTRAVENOUS | Status: DC | PRN
Start: 2019-12-14 — End: 2019-12-14
  Administered 2019-12-14 (×6): 10 mg/mL via INTRAVENOUS

## 2019-12-14 MED ORDER — FENTANYL (PF) 50 MCG/ML INJECTION SOLUTION
50 mcg/mL | INTRAVENOUS | Status: DC | PRN
Start: 2019-12-14 — End: 2019-12-14
  Administered 2019-12-14 (×2): 50 mcg/mL via INTRAVENOUS

## 2019-12-14 MED ORDER — ONDANSETRON HCL (PF) 4 MG/2 ML INJECTION SOLUTION
4 mg/2 mL | INTRAVENOUS | Status: DC | PRN
Start: 2019-12-14 — End: 2019-12-14
  Administered 2019-12-14: 14:00:00 4 mg/2 mL via INTRAVENOUS

## 2019-12-14 NOTE — Procedures
GI Outpatient ProcedurePROCEDURES:Upper endoscopy and colonoscopy.INDICATIONS:Abdominal pain, [place] showing antral thickening, diarrhea.FINDINGS:Upper endoscopy:  Upper endoscope passed through the mouth to the 2nd portion of the duodenum.  The esophagus was entirely normal.  There was minimal congestion in the antrum, biopsied for histology and H pylori urease testing.  The rest of the gastric mucosa was normal.  There was minimal congestion in the entire examined duodenum and biopsies were taken in the bulb and 2nd portion of the duodenum. Colonoscopy:  Small hemorrhoids on perianal exam.  Pediatric colonoscope passed through the rectum to the terminal ileum.  The terminal ileum was normal.  The entire examined colonic mucosa was normal and biopsies were taken randomly in the right and left colon.IMPRESSION AND RECOMMENDATIONS:1.	Essentially unremarkable upper endoscopy and colonoscopy.  2.	Small external hemorrhoids.  3.	Follow up pathology results.Electronically Signed by Terese Door, MD, December 14, 2019

## 2019-12-14 NOTE — Other
Patient tolerated procedure with no difficulties. Dr. Shamberg  spoke with patient and provided an explanation of findings from procedure. Patient provided with education and discharge instructions, verbalized understanding. No further questions at this time. Continue with plan of care.

## 2019-12-14 NOTE — Discharge Instructions
Patient Education Patient Education Patient Education Propofol injectionWhat is this medicine?PROPOFOL (proe POE fol) is an anesthetic. It is used to produce relaxation and sleep before or during surgery. It is also used in patients on a ventilator.This medicine may be used for other purposes; ask your health care provider or pharmacist if you have questions.COMMON BRAND NAME(S): Diprivan, [employer] PropovenWhat should I tell my health care provider before I take this medicine?They need to know if you have any of these conditions:?	head injury?	heart disease?	high cholesterol?	history of pancreatitis?	kidney disease?	seizures?	an unusual or allergic reaction to propofol, anesthetics, eggs, soy, peanuts, sulfites, other medicines, foods, dyes, or preservatives?	pregnant or trying to get pregnant?	breast-feedingHow should I use this medicine?This medicine is for infusion into a vein. It is given by a health care professional in a hospital or clinic setting.Talk to your pediatrician regarding the use of this medicine in children. While this drug may be prescribed for children as young as 2 months for selected conditions, precautions do apply.Overdosage: If you think you have taken too much of this medicine contact a poison control center or emergency room at once.NOTE: This medicine is only for you. Do not share this medicine with others.What if I miss a dose?This does not apply. This medicine is not for regular use.What may interact with this medicine?Do not take this medicine with any of the following medications:?	MAOIs like Carbex, Eldepryl, Marplan, Nardil, and ParnateThis medicine may also interact with the following medications:?	certain medicines for anxiety or sleep?	narcotic medicines for pain?	valproic acidThis list may not describe all possible interactions. Give your health care provider a list of all the medicines, herbs, non-prescription drugs, or dietary supplements you use. Also tell them if you smoke, drink alcohol, or use illegal drugs. Some items may interact with your medicine.What should I watch for while using this medicine?Your condition will be monitored carefully while you are receiving this medicine.You may get drowsy or dizzy. Do not drive, use machinery, or do anything that needs mental alertness until you know how this medicine affects you. Do not stand or sit up quickly, especially if you are an older patient. This reduces the risk of dizzy or fainting spells. Alcohol may interfere with the effect of this medicine. Avoid alcoholic drinks.You may need blood work done while you are taking this medicine.You should make sure you get enough zinc while you are taking this medicine. Discuss the foods you eat and the vitamins you take with your health care professional.What side effects may I notice from receiving this medicine?Side effects that you should report to your doctor or health care professional as soon as possible:?	allergic reactions like skin rash, itching or hives, swelling of the face, lips, or tongue?	breathing problems?	signs and symptoms of increased acid in the body like breathing fast; fast heartbeat; headache; confusion; unusually weak or tired; nausea, vomiting?	signs and symptoms of infection like fever; chills; cough; sore throat; pain or trouble passing urine?	signs and symptoms of low blood pressure like dizziness; feeling faint or lightheaded, falls; unusually weak or tired?	uncontrollable head, mouth, neck, arm, or leg movementsSide effects that usually do not require medical attention (report to your doctor or health care professional if they continue or are bothersome):?	changes in vision?	drowsiness?	pain, redness or irritation at site where injectedThis list may not describe all possible side effects. Call your doctor for medical advice about side effects. You may report side effects to FDA at 1-800-FDA-1088.Where should I keep my medicine?This drug is given in a hospital or clinic and will not be stored at home.NOTE: This sheet is a summary.  It may not cover all possible information. If you have questions about this medicine, talk to your doctor, pharmacist, or health care provider.? 2021 Elsevier/Gold Standard (2018-07-11 08:47:52) Upper Endoscopy, AdultUpper endoscopy is a procedure to look inside the upper GI (gastrointestinal) tract. The upper GI tract is made up of:?	The part of the body that moves food from your mouth to your stomach (esophagus).?	The stomach.?	The first part of your small intestine (duodenum).This procedure is also called esophagogastroduodenoscopy (EGD) or gastroscopy. In this procedure, your health care provider passes a thin, flexible tube (endoscope) through your mouth and down your esophagus into your stomach. A small camera is attached to the end of the tube. Images from the camera appear on a monitor in the exam room. During this procedure, your health care provider may also remove a small piece of tissue to be sent to a lab and examined under a microscope (biopsy).Your health care provider may do an upper endoscopy to diagnose cancers of the upper GI tract. You may also have this procedure to find the cause of other conditions, such as:?	Stomach pain.?	Heartburn.?	Pain or problems when swallowing.?	Nausea and vomiting.?	Stomach bleeding.?	Stomach ulcers.Tell a health care provider about:?	Any allergies you have.?	All medicines you are taking, including vitamins, herbs, eye drops, creams, and over-the-counter medicines.?	Any problems you or family members have had with anesthetic medicines.?	Any blood disorders you have.?	Any surgeries you have had.?	Any medical conditions you have.?	Whether you are pregnant or may be pregnant.What are the risks?Generally, this is a safe procedure. However, problems may occur, including:?	Infection.?	Bleeding.?	Allergic reactions to medicines.?	A tear or hole (perforation) in the esophagus, stomach, or duodenum.What happens before the procedure?Staying hydratedFollow instructions from your health care provider about hydration, which may include:?	Up to 2 hours before the procedure - you may continue to drink clear liquids, such as water, clear fruit juice, black coffee, and plain tea.Eating and drinking restrictionsFollow instructions from your health care provider about eating and drinking, which may include:?	8 hours before the procedure - stop eating heavy meals or foods, such as meat, fried foods, or fatty foods.?	6 hours before the procedure - stop eating light meals or foods, such as toast or cereal.?	6 hours before the procedure - stop drinking milk or drinks that contain milk.?	2 hours before the procedure - stop drinking clear liquids.MedicinesAsk your health care provider about:?	Changing or stopping your regular medicines. This is especially important if you are taking diabetes medicines or blood thinners.?	Taking medicines such as aspirin and ibuprofen. These medicines can thin your blood. Do not take these medicines unless your health care provider tells you to take them.?	Taking over-the-counter medicines, vitamins, herbs, and supplements.General instructions?	Plan to have someone take you home from the hospital or clinic.?	If you will be going home right after the procedure, plan to have someone with you for 24 hours.?	Ask your health care provider what steps will be taken to help prevent infection.What happens during the procedure??	An IV will be inserted into one of your veins.?	You may be given one or more of the following:?	A medicine to help you relax (sedative).?	A medicine to numb the throat (local anesthetic).?	You will lie on your left side on an exam table.?	Your health care provider will pass the endoscope through your mouth and down your esophagus.?	Your health care provider will use the scope to check the inside of your esophagus, stomach, and duodenum. Biopsies may be taken.?	The endoscope will be removed.The procedure may vary among health care providers and hospitals.What happens after the procedure??	Your blood pressure, heart rate, breathing rate, and blood oxygen level will be monitored until you leave the hospital or clinic.?	Do  not drive for 24 hours if you were given a sedative during your procedure.?	When your throat is no longer numb, you may be given some fluids to drink.?	It is up to you to get the results of your procedure. Ask your health care provider, or the department that is doing the procedure, when your results will be ready.Summary?	Upper endoscopy is a procedure to look inside the upper GI tract.?	During the procedure, an IV will be inserted into one of your veins. You may be given a medicine to help you relax.?	A medicine will be used to numb your throat.?	The endoscope will be passed through your mouth and down your esophagus.This information is not intended to replace advice given to you by your health care provider. Make sure you discuss any questions you have with your health care provider.Document Revised: 07/13/2017 Document Reviewed: 05/20/2019Elsevier Patient Education ? 2021 Elsevier Inc. Colonoscopy, Adult, Care AfterThis sheet gives you information about how to care for yourself after your procedure. Your health care provider may also give you more specific instructions. If you have problems or questions, contact your health care provider.What can I expect after the procedure?After the procedure, it is common to have:?	A small amount of blood in your stool for 24 hours after the procedure.?	Some gas.?	Mild cramping or bloating of your abdomen.Follow these instructions at home:Eating and drinking?	Drink enough fluid to keep your urine pale yellow.?	Follow instructions from your health care provider about eating or drinking restrictions.?	Resume your normal diet as instructed by your health care provider. Avoid heavy or fried foods that are hard to digest.Activity?	Rest as told by your health care provider.?	Avoid sitting for a long time without moving. Get up to take short walks every 1-2 hours. This is important to improve blood flow and breathing. Ask for help if you feel weak or unsteady.?	Return to your normal activities as told by your health care provider. Ask your health care provider what activities are safe for you.Managing cramping and bloating?	Try walking around when you have cramps or feel bloated.?	Apply heat to your abdomen as told by your health care provider. Use the heat source that your health care provider recommends, such as a moist heat pack or a heating pad.?	Place a towel between your skin and the heat source.?	Leave the heat on for 20-30 minutes.?	Remove the heat if your skin turns bright red. This is especially important if you are unable to feel pain, heat, or cold. You may have a greater risk of getting burned.General instructions?	If you were given a sedative during the procedure, it can affect you for several hours. Do not drive or operate machinery until your health care provider says that it is safe.?	For the first 24 hours after the procedure:?	Do not sign important documents.?	Do not drink alcohol.?	Do your regular daily activities at a slower pace than normal.?	Eat soft foods that are easy to digest.?	Take over-the-counter and prescription medicines only as told by your health care provider.?	Keep all follow-up visits as told by your health care provider. This is important.Contact a health care provider if:?	You have blood in your stool 2-3 days after the procedure.Get help right away if you have:?	More than a small spotting of blood in your stool.?	Large blood clots in your stool.?	Swelling of your abdomen.?	Nausea or vomiting.?	A fever.?	Increasing pain in your abdomen that is not relieved with medicine.Summary?	After the procedure, it is common to have a small amount of blood in your stool. You may also have mild cramping and bloating of your abdomen.?	If you were given a sedative during the procedure, it can affect you for several hours. Do not  drive or operate machinery until your health care provider says that it is safe.?	Get help right away if you have a lot of blood in your stool, nausea or vomiting, a fever, or increased pain in your abdomen.This information is not intended to replace advice given to you by your health care provider. Make sure you discuss any questions you have with your health care provider.Document Revised: 01/12/2019 Document Reviewed: 07/13/2020Elsevier Patient Education ? 2021 Elsevier Inc.

## 2019-12-14 NOTE — Plan of Care
Pt foot got caught in divot at work and twisted ankle

## 2019-12-14 NOTE — Other
Post Anesthesia Transfer of Care NotePatient: Jasmine L FaceyProcedure(s) Performed: Procedure(s) (LRB):UPPER GI ENDOSCOPY; W/BX, SINGLE/MULTIPLE (N/A)COLONOSCOPY, FLEXIBLE; W/BX, SINGLE/MULTIPLE. COLD BIOPSIES OF RC, LC Patient location: PACU Last Vitals: Vitals Value Taken Time BP 112/59 12/14/19 0845 Temp 97 12/14/19 0845 Pulse 79 12/14/19 0845 Resp 15 12/14/19 0845 SpO2 100% 12/14/19 0845 Level of consciousness: sedated and responds to stimulationTransport Vital Signs:  Stable since the last set of recorded intra-operative vital signsComplications: noneIntra-operative Intake & Output and Antibiotics as per Anesthesia record and discussed with the RN.

## 2019-12-14 NOTE — Other
Operative Diagnosis:Pre-op:   * No pre-op diagnosis entered * Patient Coded Diagnosis   Pre-op diagnosis: Diarrhea, unspecified type, Abdominal pain, unspecified abdominal location, Hx of colonic polyps  Post-op diagnosis: Hemorrhoids, unspecified hemorrhoid type  Patient Diagnosis   Pre-op diagnosis:   Post-op diagnosis: Gastro: Pathology pending    Post-op diagnosis:   * Hemorrhoids, unspecified hemorrhoid type [K64.9]Operative Procedure(s) :Procedure(s) (LRB):UPPER GI ENDOSCOPY; W/BX, SINGLE/MULTIPLE (N/A)COLONOSCOPY, FLEXIBLE; W/BX, SINGLE/MULTIPLE. COLD BIOPSIES OF RC, LCPost-op Procedure & Diagnosis ConfirmationPost-op Diagnosis: Post-op Diagnosis confirmed (no changes)Post-op Procedure: Post-op Procedure confirmed (no changes)Anesthesia ClarifiersGI/Endoscopy: Non-Screening Colonoscopy (see above for post-diagnosis and procedure)

## 2019-12-14 NOTE — Interval H&P Note
Subsequent to admission for surgery or invasive procedure, I have reassessed the patient by examination and review of relevant data pertaining to the planned procedure. I have verified the planned procedure and there are no relevant changes since the H&P.Electronically Signed by Terese Door, MD, December 14, 2019 .

## 2019-12-14 NOTE — Anesthesia Post-Procedure Evaluation
Anesthesia Post-op NotePatient: Jasmine L FaceyProcedure(s):  Procedure(s) (LRB):UPPER GI ENDOSCOPY; W/BX, SINGLE/MULTIPLE (N/A)COLONOSCOPY, FLEXIBLE; W/BX, SINGLE/MULTIPLE. COLD BIOPSIES OF RC, LC Patient location: PACULast Vitals:  I have noted the vital signs as listed in the nursing notes.Mental status recovered: patient participates in evaluation: YesVital signs reviewed: YesRespiratory function stable:YesAirway is patent: YesCardiovascular function and hydration status stable: YesPain control satisfactory: YesNausea and vomiting control satisfactory:Yes

## 2019-12-14 NOTE — Anesthesia Pre-Procedure Evaluation
This is a 39 y.o. female scheduled for COLONOSCOPY, (N/A )UPPER GI ENDOSCOPY; DX, W/WO SPECIMEN COLLECTION, BRUSHING/WASHING (SEP PROC) (N/A )UPPER GI ENDOSCOPY; W/BX, SINGLE/MULTIPLE (N/A ).Review of Systems/ Medical HistoryPatient summary, nursing notes, EKG/Cardiac Studies , Labs, pre-procedure vitals, height, weight and NPO status reviewed.No previous anesthesia concernsAnesthesia Evaluation: Estimated body mass index is 28.44 kg/m? as calculated from the following:  Height as of 12/12/19: 5' 4 (1.626 m).  Weight as of 12/12/19: 75.2 kg. CC/HPI: Diarrhea, abdominal painPast Medical History:No date: Asthma    Comment:  Mild, not on any medication for it08/01/2020: CholelithiasisNo date: Gravida 2 para 2    Comment:  not uncluding miscarriage2015: MiscarriagePast Surgical History:  Past Surgical History:No date: COLONOSCOPY2015: DILATION AND CURETTAGE    Comment:  after miscarriage08/2017: LAPAROSCOPIC ABDOMINAL EXPLORATION    Comment:  after IUD malfunctionNo date: TOOTH EXTRACTION    Comment:  multiple during childhood2003: WISDOM TOOTH EXTRACTION; Bilateral    Comment:  upper and lowerCardiovascular: Negative   -Vascular Disease:  Negative   Respiratory: -Airway disorders: -Asthma: yesHEENT: Negative.Neuromuscular: NegativeSkeletal/Skin:  NegativeGastrointestinal/Genitourinary:  Negative Hematological/Lymphatic: NegativeEndocrine/Metabolic:  Negative.Behavioral/Psychiatric & Syndromes: NegativePhysical ExamCardiovascular:    normal exam  Rhythm: regularHeart Sounds: S1 present and S2 present.Pulmonary:  normal exam  Patient's breath sounds clear to auscultationAirway:  Mallampati: ITM distance: >3 FBNeck ROM: fullDental:  normal exam  Anesthesia PlanASA 2 The primary anesthesia plan is  general. Perioperative Code Status confirmed: It is my understanding that the patient is currently designated as 'Full Code' and will remain so throughout the perioperative period.Anesthesia informed consent obtained. Consent obtained from: patientUse of blood products: consented  The post operative pain plan is IV analgesics and per surgeon management.Plan discussed with Attending and CRNA.Anesthesiologist's Pre Op NoteI personally evaluated and examined the patient prior to the intra-operative phase of care.

## 2019-12-15 LAB — H. PYLORI RAPID UREASE TEST     (BH GH LMW): BKR H. PYLORI RAPID UREASE TEST: NEGATIVE

## 2019-12-17 ENCOUNTER — Encounter: Admit: 2019-12-17 | Payer: PRIVATE HEALTH INSURANCE | Attending: Gastroenterology | Primary: Internal Medicine

## 2019-12-17 DIAGNOSIS — J45909 Unspecified asthma, uncomplicated: Secondary | ICD-10-CM

## 2019-12-17 DIAGNOSIS — K8020 Calculus of gallbladder without cholecystitis without obstruction: Secondary | ICD-10-CM

## 2019-12-17 DIAGNOSIS — Z789 Other specified health status: Secondary | ICD-10-CM

## 2019-12-17 DIAGNOSIS — O039 Complete or unspecified spontaneous abortion without complication: Secondary | ICD-10-CM

## 2019-12-17 NOTE — Other
Negative rapid H pylori ureaseElectronically Signed by Terese Door, MD, December 17, 2019

## 2019-12-18 NOTE — Other
Unremarkable upper endoscopy and random colon biopsiesElectronically Signed by Terese Door, MD, December 18, 2019

## 2019-12-26 ENCOUNTER — Encounter: Admit: 2019-12-26 | Payer: PRIVATE HEALTH INSURANCE | Attending: Surgery | Primary: Internal Medicine

## 2019-12-26 DIAGNOSIS — K760 Fatty (change of) liver, not elsewhere classified: Secondary | ICD-10-CM

## 2019-12-26 DIAGNOSIS — O039 Complete or unspecified spontaneous abortion without complication: Secondary | ICD-10-CM

## 2019-12-26 DIAGNOSIS — K8020 Calculus of gallbladder without cholecystitis without obstruction: Secondary | ICD-10-CM

## 2019-12-26 DIAGNOSIS — J45909 Unspecified asthma, uncomplicated: Secondary | ICD-10-CM

## 2019-12-26 DIAGNOSIS — Z789 Other specified health status: Secondary | ICD-10-CM

## 2019-12-26 NOTE — Other
PERI-OP CHART CHECKLIST Date Document Location CONSENT  DOS []  Media [x]    H&P 11/10 No VS DOS  []  Notes [x]  CE []  EKG  EKG []  Media []  CE  []  LABS 8/15 Abn Labs [x]  Media []  CE []  MRSA  Neg []  Pos []  CE []  MED CL/NOTE  Notes []  Media []  CE []  CARD CL/NOTE  Notes []  Media []  CE []  CXR  Imaging []  Media []  CE []  PATA anesth  [place] []  Other provider []    Notes:

## 2019-12-28 ENCOUNTER — Inpatient Hospital Stay: Admit: 2019-12-28 | Discharge: 2019-12-28 | Payer: BLUE CROSS/BLUE SHIELD | Primary: Internal Medicine

## 2019-12-28 DIAGNOSIS — Z01818 Encounter for other preprocedural examination: Secondary | ICD-10-CM

## 2019-12-28 DIAGNOSIS — Z01812 Encounter for preprocedural laboratory examination: Secondary | ICD-10-CM

## 2019-12-28 DIAGNOSIS — Z20822 Contact with and (suspected) exposure to covid-19: Secondary | ICD-10-CM

## 2019-12-28 LAB — COVID-19 CLEARANCE OR FOR PLACEMENT ONLY: BKR SARS-COV-2 RNA (COVID-19) (YH): NEGATIVE

## 2019-12-29 ENCOUNTER — Ambulatory Visit: Admit: 2019-12-29 | Payer: BLUE CROSS/BLUE SHIELD | Primary: Internal Medicine

## 2019-12-30 ENCOUNTER — Ambulatory Visit: Admit: 2019-12-30 | Payer: PRIVATE HEALTH INSURANCE | Attending: Anesthesiology | Primary: Internal Medicine

## 2019-12-30 DIAGNOSIS — K8020 Calculus of gallbladder without cholecystitis without obstruction: Secondary | ICD-10-CM

## 2019-12-31 ENCOUNTER — Inpatient Hospital Stay: Admit: 2019-12-31 | Discharge: 2019-12-31 | Payer: BLUE CROSS/BLUE SHIELD | Primary: Internal Medicine

## 2019-12-31 ENCOUNTER — Encounter: Admit: 2019-12-31 | Payer: PRIVATE HEALTH INSURANCE | Attending: Surgery | Primary: Internal Medicine

## 2019-12-31 ENCOUNTER — Ambulatory Visit: Admit: 2019-12-31 | Payer: PRIVATE HEALTH INSURANCE | Attending: Anesthesiology | Primary: Internal Medicine

## 2019-12-31 DIAGNOSIS — K8010 Calculus of gallbladder with chronic cholecystitis without obstruction: Secondary | ICD-10-CM

## 2019-12-31 DIAGNOSIS — O039 Complete or unspecified spontaneous abortion without complication: Secondary | ICD-10-CM

## 2019-12-31 DIAGNOSIS — K8020 Calculus of gallbladder without cholecystitis without obstruction: Secondary | ICD-10-CM

## 2019-12-31 DIAGNOSIS — K760 Fatty (change of) liver, not elsewhere classified: Secondary | ICD-10-CM

## 2019-12-31 DIAGNOSIS — J45909 Unspecified asthma, uncomplicated: Secondary | ICD-10-CM

## 2019-12-31 DIAGNOSIS — Z789 Other specified health status: Secondary | ICD-10-CM

## 2019-12-31 MED ORDER — FENTANYL (PF) 50 MCG/ML INJECTION SOLUTION
50 mcg/mL | INTRAVENOUS | Status: DC | PRN
Start: 2019-12-31 — End: 2020-01-01
  Administered 2019-12-31: 20:00:00 50 mL via INTRAVENOUS

## 2019-12-31 MED ORDER — ACETAMINOPHEN 325 MG TABLET
325 mg | ORAL_TABLET | Freq: Four times a day (QID) | ORAL | 12 refills | Status: AC | PRN
Start: 2019-12-31 — End: ?

## 2019-12-31 MED ORDER — DEXMEDETOMIDINE 100 MCG/ML INTRAVENOUS SOLUTION
100 mcg/mL | INTRAVENOUS | Status: DC | PRN
Start: 2019-12-31 — End: 2019-12-31
  Administered 2019-12-31 (×5): 100 mcg/mL via INTRAVENOUS

## 2019-12-31 MED ORDER — OXYCODONE IMMEDIATE RELEASE 5 MG TABLET
5 mg | ORAL | Status: DC | PRN
Start: 2019-12-31 — End: 2020-01-01
  Administered 2019-12-31: 21:00:00 5 mg via ORAL

## 2019-12-31 MED ORDER — LIDOCAINE-EPINEPHRINE (PF) 1 %-1:200,000 INJECTION SOLUTION
1 %-:200,000 | Status: CP
Start: 2019-12-31 — End: ?

## 2019-12-31 MED ORDER — HYDROMORPHONE 2 MG/ML INJECTION SOLUTION
2 mg/mL | INTRAVENOUS | Status: DC | PRN
Start: 2019-12-31 — End: 2020-01-01

## 2019-12-31 MED ORDER — PROPOFOL 10 MG/ML INTRAVENOUS EMULSION
10 mg/mL | INTRAVENOUS | Status: DC | PRN
Start: 2019-12-31 — End: 2019-12-31
  Administered 2019-12-31 (×2): 10 mg/mL via INTRAVENOUS

## 2019-12-31 MED ORDER — FENTANYL (PF) 50 MCG/ML INJECTION SOLUTION
50 mcg/mL | Status: CP
Start: 2019-12-31 — End: ?

## 2019-12-31 MED ORDER — MIDAZOLAM 1 MG/ML INJECTION SOLUTION
1 mg/mL | Status: CP
Start: 2019-12-31 — End: ?

## 2019-12-31 MED ORDER — FENTANYL (PF) 50 MCG/ML INJECTION SOLUTION
50 mcg/mL | INTRAVENOUS | Status: DC | PRN
Start: 2019-12-31 — End: 2020-01-01

## 2019-12-31 MED ORDER — FENTANYL (PF) 50 MCG/ML INJECTION SOLUTION
50 mcg/mL | INTRAVENOUS | Status: DC | PRN
Start: 2019-12-31 — End: 2019-12-31
  Administered 2019-12-31 (×4): 50 mcg/mL via INTRAVENOUS

## 2019-12-31 MED ORDER — SUGAMMADEX 100 MG/ML INTRAVENOUS SOLUTION
100 mg/mL | INTRAVENOUS | Status: DC | PRN
Start: 2019-12-31 — End: 2019-12-31
  Administered 2019-12-31: 18:00:00 100 mg/mL via INTRAVENOUS

## 2019-12-31 MED ORDER — OXYCODONE IMMEDIATE RELEASE 5 MG TABLET
5 mg | ORAL_TABLET | ORAL | 1 refills | Status: AC | PRN
Start: 2019-12-31 — End: 2021-12-14

## 2019-12-31 MED ORDER — PROPOFOL 10 MG/ML INTRAVENOUS EMULSION
10 mg/mL | INTRAVENOUS | Status: DC | PRN
Start: 2019-12-31 — End: 2019-12-31
  Administered 2019-12-31: 17:00:00 10 mL/h via INTRAVENOUS

## 2019-12-31 MED ORDER — MEPERIDINE 25 MG/2.5 ML IN 0.9% SODIUM CHLORIDE
INTRAVENOUS | Status: DC | PRN
Start: 2019-12-31 — End: 2020-01-01

## 2019-12-31 MED ORDER — ONDANSETRON HCL (PF) 4 MG/2 ML INJECTION SOLUTION
4 mg/2 mL | INTRAVENOUS | Status: DC | PRN
Start: 2019-12-31 — End: 2019-12-31
  Administered 2019-12-31: 18:00:00 4 mg/2 mL via INTRAVENOUS

## 2019-12-31 MED ORDER — LACTATED RINGERS INTRAVENOUS SOLUTION
INTRAVENOUS | Status: DC
Start: 2019-12-31 — End: 2020-01-01
  Administered 2019-12-31: 15:00:00 1000.000 mL/h via INTRAVENOUS

## 2019-12-31 MED ORDER — LIDOCAINE-EPINEPHRINE (PF) 1 %-1:200,000 INJECTION SOLUTION
1 %-:200,000 | Status: DC | PRN
Start: 2019-12-31 — End: 2019-12-31
  Administered 2019-12-31: 18:00:00 1 %-:200,000

## 2019-12-31 MED ORDER — ONDANSETRON HCL (PF) 4 MG/2 ML INJECTION SOLUTION
4 mg/2 mL | INTRAVENOUS | Status: DC | PRN
Start: 2019-12-31 — End: 2020-01-01

## 2019-12-31 MED ORDER — BUPIVACAINE (PF) 0.5 % (5 MG/ML) INJECTION SOLUTION
0.5 % (5 mg/mL) | Status: DC | PRN
Start: 2019-12-31 — End: 2019-12-31
  Administered 2019-12-31: 18:00:00 0.5 % (5 mg/mL)

## 2019-12-31 MED ORDER — LIDOCAINE (PF) 100 MG/5 ML (2 %) INTRAVENOUS SYRINGE
100 mg/5 mL (2 %) | INTRAVENOUS | Status: DC | PRN
Start: 2019-12-31 — End: 2019-12-31
  Administered 2019-12-31: 17:00:00 100 mg/5 mL (2 %) via INTRAVENOUS

## 2019-12-31 MED ORDER — LACTATED RINGERS INTRAVENOUS SOLUTION
INTRAVENOUS | Status: DC | PRN
Start: 2019-12-31 — End: 2019-12-31
  Administered 2019-12-31: 17:00:00 via INTRAVENOUS

## 2019-12-31 MED ORDER — POLYETHYLENE GLYCOL 3350 17 GRAM ORAL POWDER PACKET
17 gram | Freq: Every day | ORAL | 2 refills | Status: AC
Start: 2019-12-31 — End: 2021-12-14

## 2019-12-31 MED ORDER — ROCURONIUM 10 MG/ML INTRAVENOUS SOLUTION
10 mg/mL | Status: CP
Start: 2019-12-31 — End: ?

## 2019-12-31 MED ORDER — SODIUM CHLORIDE 0.9 % (FLUSH) INJECTION SYRINGE
0.9 % | INTRAVENOUS | Status: DC | PRN
Start: 2019-12-31 — End: 2020-01-01

## 2019-12-31 MED ORDER — OXYCODONE IMMEDIATE RELEASE 5 MG TABLET
5 mg | Status: CP
Start: 2019-12-31 — End: ?

## 2019-12-31 MED ORDER — OXYCODONE (ROXICODONE) IMMEDIATE RELEASE 2.5 MG HALFTAB
2.5 mg | ORAL | Status: DC | PRN
Start: 2019-12-31 — End: 2020-01-01

## 2019-12-31 MED ORDER — SODIUM CHLORIDE 0.9 % (FLUSH) INJECTION SYRINGE
0.9 % | Freq: Three times a day (TID) | INTRAVENOUS | Status: DC
Start: 2019-12-31 — End: 2020-01-01

## 2019-12-31 MED ORDER — BUPIVACAINE (PF) 0.5 % (5 MG/ML) INJECTION SOLUTION
0.5 % (5 mg/mL) | Status: CP
Start: 2019-12-31 — End: ?

## 2019-12-31 MED ORDER — KETOROLAC 30 MG/ML (1 ML) INJECTION SOLUTION
30 mg/mL (1 mL) | INTRAVENOUS | Status: DC | PRN
Start: 2019-12-31 — End: 2019-12-31
  Administered 2019-12-31: 18:00:00 30 mg/mL (1 mL) via INTRAVENOUS

## 2019-12-31 MED ORDER — MIDAZOLAM (PF) 1 MG/ML INJECTION SOLUTION
1 mg/mL | INTRAVENOUS | Status: DC | PRN
Start: 2019-12-31 — End: 2019-12-31
  Administered 2019-12-31: 17:00:00 1 mg/mL via INTRAVENOUS

## 2019-12-31 MED ORDER — DEXAMETHASONE SODIUM PHOSPHATE 10 MG/ML INJECTION SOLUTION
10 mg/mL | INTRAVENOUS | Status: DC | PRN
Start: 2019-12-31 — End: 2019-12-31
  Administered 2019-12-31: 17:00:00 10 mg/mL via INTRAVENOUS

## 2019-12-31 NOTE — Other
November 29, 2021PREOP DX:  SYMPTOMATIC CHOLELITHIASISOP DX:  SAAOPERATION:  LAPAROSCOPIC CHOLEYCYSTECTOMYSURGEON:  Claudie Fisherman MD FACSFIRST ASSISTANT:  Evonnie Pat MDANESTHESIA:  GETCOMPLICATIONS:  NONEEBL:  NONEINDICATIONS:  THE PATIENT PRESENTS WITH SYMPTOMATIC CHOLELITHIASISPROCEDURE:  UNDER GET AFTER TIME OUT UNDER CMS GUIDELINES AND APPROPRIATE ANTIBIOTICS THE PATIENT WAS PREPPED AND DRAPED IN THE NORMAL STERILE FASHION.  WE USED 0.5% MARCAINE IN EACH INCISION.  USED A VISAPORT TO ENTER THE ABDOMINAL CAVITY. OTHER PORTS PLACED IN THE STANDARD FASHION.  THE ABDOMINAL CAVITY WAS BRIEFLY EVALUATED.  DISSECTION WAS BEGUN ON THE GALLBLADDER DISSECTING TO THE TRIANGLE OF CALOT.  THE CYSTIC DUCT AND ARTERY WERE IDENTIFIED.  WE INCISED THE PERITONEAL ATTACHMENTS ANTERIORLY AND POSTERIORLY.  THE CRITICAL VIEW WAS ESTABLISHED.  WE CLIPPED THE CYSTIC DUCT TIMES TWO PROXIMALLY AND ONCE DISTALLY AND DIVIDED.  THE CYSTIC ARTERY WAS CLIPPED TWICE PROXIMALLY AND ONCE DISTALLY.  DIVIDED.  WE THEN DISSECTED THE GALLBLADDER FREE FROM THE LIVER BED.  THE GALLBLADDER WAS REMOVED FROM THE PERI-UMBILICAL PORT IN A BAG.  WE IRRIGATED AND CHECKED OUR CLIPS.  WE THEN CLOSED THE MILDLY DILATED PERI-UMBILICAL PORT WITH AN 0-VICRYL AND AN ENDOCLOSE.  WE DESUFFLATED.  THE SKIN WAS CLOSED WITH 4-0 MONOCRYL IN A SUBCUTICULAR FASHION,  COUNTS WERE CORRECT,.  PATIENT TOLERATED THE PROCEDURE WELL.  JWT

## 2019-12-31 NOTE — Discharge Instructions
Physician Discharge InstructionsThank you for allowing [place] to participate in your care. If you have any questions in between the time you are discharged and the date of your next appointment, you should contact our office.Wound Care:You have steri-strips, small white tape, as your dressing. Please leave steri-strips in place. When steri-strips begin to peel off, you may remove them. You may shower. Pat dry. Do not scrub incision; apply lotions/creams to incisions or area around incisions. Clean gently with soap and water. Please do not soak the wounds; this means no baths, hot tubs, oceans, pools, etc. for 10 days. Observe your surgical site(s) for proper healing. It is normal for bruising and swelling to get worse 2-3 days after surgery before it gets better (which takes weeks to months to resolve).Dressings to become stained for several days after surgery.Activity:You may shower 2 days after your surgery. Light activity is permitted (i.e. walking). Try to be out of bed during the day, except for a short nap or rest period. Eat small frequent meals, especially when taking narcotic pain medicine as it can cause nausea on an empty stomach.Avoid:No driving while taking narcotic pain medication or muscle relaxer. Avoid strenuous activity; no pushing, pulling, overhead reaching, or heavy lifting greater than a gallon of milk until cleared by your doctor. Diet: Regular diet Medication:You should resume your medications as detailed below. You should take an over the counter stool softener and may need a laxative while taking narcotic medications. Do not take your narcotic medication with acetaminophen (Tylenol) or other acetaminophen containing products. You may take plain acetaminophen and reserve the narcotic medication for moderate to severe pain or for night time use. The daily maximum limit of acetaminophen for adults is 3000mg . Call your doctor for:- If you have bleeding, swelling, redness, increased warmth, increased drainage, cloudy/gray discharge, or more pain/discomfort from the wound. - A fever >101.5*F- Increased abdominal pain, distention, nausea or vomiting.- Unable to keep food down.- Shortness of breath, chest pain, or dizziness that does not resolve with rest.- Questions or concerns!

## 2019-12-31 NOTE — Interval H&P Note
Subsequent to admission for surgery or invasive procedure, I have reassessed the patient by examination and review of relevant data pertaining to the planned procedure. I have verified the planned procedure and there are no relevant changes since the H&P.Physical ExamGEN: NAD, lying comfortably in bedNEURO: Awake, alert, oriented x3, responding appropriatelyHEENT: NCATCV: Regular rate, regular rhythmPULM: Breathing comfortably on RA, clear to auscultation b/l ABD: Soft, non-tender, non-distended. EXT: Warm & perfused, no edema11/29/2021 10:02 AM  LOS: 0 days  Day of SurgeryTemp:  [97 ?F (36.1 ?C)] 97 ?F (36.1 ?C)Pulse:  [83] 83Resp:  [18] 18BP: (121)/(85) 121/85SpO2:  [99 %] 99 % Jasmine Johnston, [id number] General Surgery Reachable by [facility] number

## 2019-12-31 NOTE — Other
Operative Diagnosis:Pre-op:   K80.20 Patient Coded Diagnosis   Pre-op diagnosis: Symptomatic cholelithiasis  Post-op diagnosis: Symptomatic cholelithiasis  Patient Diagnosis   Pre-op diagnosis: K80.20  Post-op diagnosis:     Post-op diagnosis:   * Symptomatic cholelithiasis [K80.20]Operative Procedure(s) :Procedure(s) (LRB):LAPAROSCOPIC  CHOLECYSTECTOMY (N/A)Post-op Procedure & Diagnosis ConfirmationPost-op Diagnosis: Post-op Diagnosis confirmed (no changes)Post-op Procedure: Post-op Procedure confirmed (no changes)

## 2019-12-31 NOTE — Other
Post Anesthesia Transfer of Care NotePatient: Jasmine Ala FaceyProcedure(s) Performed: Procedure(s) (LRB):LAPAROSCOPIC  CHOLECYSTECTOMY (N/A) Patient location: PACU Last Vitals: Vitals Value Taken Time BP 120/70 12/31/19 1355 Temp 36.6 ?C 12/31/19 1351 Pulse 73 12/31/19 1357 Resp 15 12/31/19 1357 SpO2 100 % 12/31/19 1357 Vitals shown include unvalidated device data.Level of consciousness: responds to stimulationTransport Vital Signs:  Stable since the last set of recorded intra-operative vital signsComplications: noneIntra-operative Intake & Output and Antibiotics as per Anesthesia record and discussed with the RN.

## 2019-12-31 NOTE — Anesthesia Pre-Procedure Evaluation
This is a 39 y.o. female scheduled for LAPAROSCOPIC  CHOLECYSTECTOMY (N/A Abdomen).Review of Systems/ Medical HistoryPatient summary, nursing notes, EKG/Cardiac Studies , Labs, pre-procedure vitals, height, weight and NPO status reviewed.No previous anesthesia concernsAnesthesia Evaluation: Estimated body mass index is 27.81 kg/m? as calculated from the following:  Height as of this encounter: 5' 4 (1.626 m).  Weight as of this encounter: 73.5 kg. CC/HPI: Cholelithiasis	MiscarriageFatty liverAsthma	Past Surgical History:  COLONOSCOPY	DILATION AND CURETTAGELAPAROSCOPIC ABDOMINAL EXPLORATION	WISDOM TOOTH EXTRACTION	Cardiovascular: Negative   -Vascular Disease:  Negative   Respiratory: -Airway disorders: -Asthma: yesHEENT: Negative.Neuromuscular: NegativeSkeletal/Skin:  NegativeGastrointestinal/Genitourinary: -Hepatic Disorders:  Patient has liver disease and cholelithiasis.Hematological/Lymphatic: NegativeEndocrine/Metabolic:  Negative.Behavioral/Psychiatric & Syndromes: NegativeAdditional Findings: Labs:BUN-10Creat-0.86Hb-15.7Hct-48.8Plt-299Physical ExamCardiovascular:    normal exam  Rhythm: regularHeart Sounds: S1 present and S2 present.Pulmonary:  normal exam  Patient's breath sounds clear to auscultationAirway:  Mallampati: ITM distance: >3 FBNeck ROM: fullDental:  normal exam  Anesthesia PlanASA 2 The primary anesthesia plan is  general ETT. Perioperative Code Status confirmed: It is my understanding that the patient is currently designated as 'Full Code' and will remain so throughout the perioperative period.Anesthesia informed consent obtained. Anesthesia written consent obtainedConsent obtained from: patientUse of blood products: consented  The post operative pain plan is IV analgesics.Plan discussed with Attending and CRNA.Anesthesiologist's Pre Op NoteI personally evaluated and examined the patient prior to the intra-operative phase of care.

## 2019-12-31 NOTE — Anesthesia Post-Procedure Evaluation
Anesthesia Post-op NotePatient: Jasmine Ala FaceyProcedure(s):  Procedure(s) (LRB):LAPAROSCOPIC  CHOLECYSTECTOMY (N/A) Patient location: PACULast Vitals:  I have noted the vital signs as listed in the nursing notes.Mental status recovered: patient participates in evaluation: YesVital signs reviewed: YesRespiratory function stable:YesAirway is patent: YesCardiovascular function and hydration status stable: YesPain control satisfactory:yesNausea and vomiting control satisfactory:YesNo complications documented.

## 2019-12-31 NOTE — Other
Pt cleared for d/c. Discharge instructions and education provided. Pt verbalized understanding, all questions answered. Pt tolerated PO. Pt AAOx4. IV removed. VSS. Pt left in good condition via wheelchair.

## 2020-11-06 MED ORDER — MULTIVITAMIN CAPSULE
Freq: Every day | ORAL | 0.00 refills | 90.00000 days | Status: AC
Start: 2020-11-06 — End: ?

## 2020-11-11 ENCOUNTER — Encounter: Admit: 2020-11-11 | Payer: PRIVATE HEALTH INSURANCE | Attending: Adult Health | Primary: Internal Medicine

## 2020-11-11 DIAGNOSIS — Z01818 Encounter for other preprocedural examination: Secondary | ICD-10-CM

## 2020-11-21 ENCOUNTER — Inpatient Hospital Stay: Admit: 2020-11-21 | Discharge: 2020-11-21 | Payer: BLUE CROSS/BLUE SHIELD | Primary: Internal Medicine

## 2020-11-21 DIAGNOSIS — Z01812 Encounter for preprocedural laboratory examination: Secondary | ICD-10-CM

## 2020-11-21 DIAGNOSIS — Z01818 Encounter for other preprocedural examination: Secondary | ICD-10-CM

## 2020-11-21 DIAGNOSIS — Z20822 Contact with and (suspected) exposure to covid-19: Secondary | ICD-10-CM

## 2020-11-21 LAB — COVID-19 CLEARANCE OR FOR PLACEMENT ONLY: BKR SARS-COV-2 RNA (COVID-19) (YH): NEGATIVE

## 2020-11-24 ENCOUNTER — Inpatient Hospital Stay
Admit: 2020-11-24 | Discharge: 2020-11-24 | Payer: BLUE CROSS/BLUE SHIELD | Attending: Gastroenterology | Primary: Internal Medicine

## 2020-11-26 NOTE — Procedures
FIBROSCAN LIVER ELASTOGRAPHY NOTEIndication: Fatty metamorphosis of Liver Previous Biopsy: NoBMI: 30.04  kg/m2NPO X 3 Hours: YesProbe: XL Median Liver Stiffness: 5.9  kPaIQR/Median: 19 %Controlled Attenuation Parameter: 318 dB/mInterpretation: The findings of this liver elastography procedure are consistent with no liver fibrosis (F0 ), and does not suggest the presence of advanced liver disease. This procedure does not represent the gold standard assessment tool for liver fibrosis, and therefore liver biopsy should be considered if there remains clinical concern for advanced liver fibrosis. Please note, liver elastography measurements may be less reliable in patients with a BMI > 35.CAP score is consistent with a 67% or more fatty change in the liver (S3).Clinical correlation is recommended .

## 2021-05-05 ENCOUNTER — Encounter: Admit: 2021-05-05 | Payer: PRIVATE HEALTH INSURANCE | Attending: Gastroenterology | Primary: Internal Medicine

## 2021-05-05 ENCOUNTER — Encounter: Admit: 2021-05-05 | Payer: BLUE CROSS/BLUE SHIELD | Attending: Gastroenterology | Primary: Internal Medicine

## 2021-05-05 DIAGNOSIS — K7581 Nonalcoholic steatohepatitis (NASH): Secondary | ICD-10-CM

## 2021-05-05 DIAGNOSIS — Z789 Other specified health status: Secondary | ICD-10-CM

## 2021-05-05 DIAGNOSIS — O039 Complete or unspecified spontaneous abortion without complication: Secondary | ICD-10-CM

## 2021-05-05 DIAGNOSIS — J45909 Unspecified asthma, uncomplicated: Secondary | ICD-10-CM

## 2021-05-05 DIAGNOSIS — K760 Fatty (change of) liver, not elsewhere classified: Secondary | ICD-10-CM

## 2021-05-05 DIAGNOSIS — K8020 Calculus of gallbladder without cholecystitis without obstruction: Secondary | ICD-10-CM

## 2021-05-06 ENCOUNTER — Encounter: Admit: 2021-05-06 | Payer: PRIVATE HEALTH INSURANCE | Attending: Gastroenterology | Primary: Internal Medicine

## 2021-05-06 MED ORDER — OZEMPIC 0.25 MG OR 0.5 MG (2 MG/1.5 ML) SUBCUTANEOUS PEN INJECTOR
0.250.521.5 mg or 0.5 mg(2 mg/1.5 mL) | SUBCUTANEOUS | 3 refills | 42.00 days | Status: AC
Start: 2021-05-06 — End: ?

## 2021-05-07 ENCOUNTER — Encounter: Admit: 2021-05-07 | Payer: BLUE CROSS/BLUE SHIELD | Attending: Gastroenterology | Primary: Internal Medicine

## 2021-05-08 ENCOUNTER — Encounter: Admit: 2021-05-08 | Payer: PRIVATE HEALTH INSURANCE | Attending: Gastroenterology | Primary: Internal Medicine

## 2021-05-08 NOTE — Progress Notes
[id number] by:  Marga Melnick Sali*Referral for:  NONALCOHOLIC STEATOHEPATITIS (NASH)HPI: The patient is a 41 y.o. female who is presenting for initial evaluation.Weight is 175lbs with a BMI of 30, height 5' 4.Mild AST/ALT elevations since at least 2021.Ultrasound Nov 2021: steatosis.First aware of liver issue in Aug 2021 with steatosis on [place].Cholecystectomy Nov 2021.No blood transfusion.1-2 drinks a weeks. In college greater 4X a week.Adopted. No family history.From [place].Came to [place] at 23 monts age.Pregnancy at age 54 and 12yrs. Not much weight gain.Transient elastography (Fibroscan) Oct 2022: Median Liver Stiffness: 5.9  kPaPertinent negative results, HBV/HCv serology, ANTI-NUCLEAR ANTIBODY (ANA), ferritin, a-1.Weight history:?	Early childhood: Average?	Late childhood: Average. ?	Ages 57-30: Gained in college?	31-40 years: 168lbs ?	Following people in the family were overweight: Adopted brother is overweight, some extended family.?	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	Typical sleep hours are: 11PM-6AM.?	Typical work hours are: Full time. Works in [employer]. ?	Grocery shopping is done by: Geophysicist/field seismologist.?	Cooking is done by: Patient.?	Significant snacking. Mostly crunchy savory items.?	Fast food consumption: 1X week.?	How often do you take Juice, soda, diet soda, coffee/tea, smoothies, milky drinks:       Coffee 1-2 day with cream. No soda.Alcohol & Drug Use: 2 drinks a week.Social History Substance and Sexual Activity Alcohol Use Yes  Comment: Socially or once a week Social History Substance and Sexual Activity Drug Use Never Allergies      Lactose and NeomycinActivity1.	Exercises: Minimal.2.  Activity Level:            Low.Social HistoryMarital Status:Married?	Number of children: 2 children.?	Present Occupation PMH:  has a past medical history of Asthma, Cholelithiasis (09/13/2019), Fatty liver, Gravida 2 para 2, and Miscarriage (2015).PSH: has a past surgical history that includes Colonoscopy; dilation and curettage (2015); laparoscopic abdominal exploration (09/2015); Wisdom tooth extraction (Bilateral, 2003); Tooth extraction; Colonoscopy w/ or w/o biopsy (12/14/2019); and Cholecystectomy (12/2019).Family History:family history is not on file. She was adopted.:Previous Psychiatric History: NoMedicationsCurrent Outpatient Medications Medication Sig ? multivitamin Take 1 capsule by mouth daily. ? BIOTIN ORAL Take 2 each by mouth daily. gummies ? oxyCODONE Take 1 tablet (5 mg total) by mouth every 4 (four) hours as needed. ? polyethylene glycol Take 1 packet (17 g total) by mouth daily. Mix in 8 ounces of water, juice, soda, coffee or tea prior to taking. Care TeamPatient Care Team:No Pcp, (Do Not Rename Record) as PCP - Zenaida Niece Phylliss Blakes, MD (Inactive) as Physician (Surgery)Previous weight loss effortsStarted at age: [facility]. My fitness pal. ROSFeels well.Physical ExamHt 5' 4 (1.626 m) Comment: per pt - Wt 79.4 kg Comment: per pt this am - BMI 30.04 kg/m?  Video visit.Labs: ALT Date Value Ref Range Status 11/13/2020 27 6 - 29 U/L Final Alanine Aminotransferase (ALT) Date Value Ref Range Status 09/16/2019 59 (H) 10 - 35 U/L Final   Comment:   Calcium dobesilate can cause artificially low ALT results at therapeutic concentrations Alkaline Phosphatase Date Value Ref Range Status 11/13/2020 47 31 - 125 U/L Final 09/16/2019 61 9 - 122 U/L Final Creatinine Date Value Ref Range Status 11/13/2020 0.73 0.50 - 0.97 mg/dL Final 40/98/1191 4.78 2.95 - 1.30 mg/dL Final Bilirubin, Total Date Value Ref Range Status 11/13/2020 0.4 0.2 - 1.2 mg/dL Final Total Bilirubin Date Value Ref Range Status 09/16/2019 0.4 <=1.2 mg/dL Final Bilirubin, Direct Date Value Ref Range Status 11/04/2020 0.1 < OR = 0.2 mg/dL Final Albumin Date Value Ref Range Status 11/13/2020 4.3 3.6 - 5.1 g/dL Final 62/13/0865 4.6 3.6 - 4.9 g/dL Final Hematocrit Date Value Ref Range Status 11/13/2020 41.7 35.0 - 45.0 %  Final Platelets Date Value Ref Range Status 11/13/2020 306 140 - 400 Thousand/uL Final 09/16/2019 299 120 - 450 x1000/?L Final Glucose Date Value Ref Range Status 11/13/2020 103 (H) 65 - 99 mg/dL Final   Comment:               Fasting reference interval For someone without known diabetes, a glucose valuebetween 100 and 125 mg/dL is consistent withprediabetes and should be confirmed with afollow-up test.  09/16/2019 116 (H) 70 - 100 mg/dL Final Triglycerides Date Value Ref Range Status 11/13/2020 128 <150 mg/dL Final Hemoglobin Z6X Date Value Ref Range Status 11/13/2020 5.5 <5.7 % of total Hgb Final   Comment:   For the purpose of screening for the presence ofdiabetes: <5.7%       Consistent with the absence of diabetes5.7-6.4%    Consistent with increased risk for diabetes            (prediabetes)> or =6.5%  Consistent with diabetes This assay result is consistent with a decreased riskof diabetes. Currently, no consensus exists regarding use ofhemoglobin A1c for diagnosis of diabetes in children. According to American Diabetes Association (ADA)guidelines, hemoglobin A1c <7.0% represents optimalcontrol in non-pregnant diabetic patients. Differentmetrics may apply to specific patient populations. Standards of Medical Care in Diabetes(ADA).   LDL Cholesterol Date Value Ref Range Status 11/13/2020 141 (H) mg/dL (calc) Final   Comment:   Reference range: <100 Desirable range <100 mg/dL for primary prevention;  <70 mg/dL for patients with CHD or diabetic patients with > or = 2 CHD risk factors. LDL-C is now calculated using the Martin-Hopkins calculation, which is a validated novel method providing better accuracy than the Friedewald equation in the estimation of LDL-C. Horald Pollen et al. Lenox Ahr. 0960;454(09): 2061-2068 (http://education.[facility].com/faq/FAQ164) HDL Date Value Ref Range Status 11/13/2020 46 (L) > OR = 50 mg/dL Final Cholesterol, Total Date Value Ref Range Status 11/13/2020 212 (H) <200 mg/dL Final Bariatric/Anthropometric Data:  05/05/2021   3:47 PM 11/06/2020   3:37 PM 12/31/2019   9:16 AM 12/26/2019   3:09 PM 12/14/2019   7:39 AM 12/12/2019   1:32 PM 11/22/2019  12:52 PM Bariatric Weight Loss Flowsheet Height 5' 4 5' 4 5' 4 5' 4 5' 4 5' 4 5' 4 Weight 175 lbs 175 lbs 160 lbs 8 oz 162 lbs 163 lbs 165 lbs 11 oz 166 lbs 13 oz BMI (Calculated) 30 30 27.6 27.9 28 28.5 28.7 Tests for Liver Disease: HAV IgG: HBsAg: negHBsAb: HBcAb: HCV antibody: negIron saturation: Ferritin: normalANA: negAnti-smooth muscle antibody: Ceruloplasmin: normal?	Assessment and Plan: The patient is a 41 y.o. female who is presenting for initial evaluation.Weight is 175lbs with a BMI of 30, height 5' 4.Mild AST/ALT elevations since at least 2021.Ultrasound Nov 2021: steatosis.First aware of liver issue in Aug 2021 with steatosis on [place].Cholecystectomy Nov 2021.No blood transfusion.1-2 drinks a weeks. In college greater 4X a week.Adopted. No family history.From [place].Came to [place] at 52 monts age.Pregnancy at age 67 and 45yrs. 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 I have reviewed the required lifestyle changes with an emphasis on improving the food environment.In addition I have advised starting Ozempic which Ms. Boven will consider and let me know by MyChart.VIDEO TELEHEALTH VISIT: This clinician is part of the telehealth program and is conducting this visit in a currently approved location. For this visit the clinician and patient were present via  interactive audio & video telecommunications system that permits real-time communications, via the [employer].Patient's use of the telehealth platform followed consent and acknowledges agreement to permit telehealth for this visit. [place] patient is located in: [place] clinician is appropriately licensed in the above state to provide care for this visit. Other individuals present during the telehealth encounter and their role/relation: none

## 2021-06-03 ENCOUNTER — Encounter: Admit: 2021-06-03 | Payer: PRIVATE HEALTH INSURANCE | Primary: Internal Medicine

## 2021-06-15 ENCOUNTER — Encounter: Admit: 2021-06-15 | Payer: PRIVATE HEALTH INSURANCE | Attending: Internal Medicine | Primary: Internal Medicine

## 2021-06-15 ENCOUNTER — Ambulatory Visit: Admit: 2021-06-15 | Payer: BLUE CROSS/BLUE SHIELD | Attending: Internal Medicine | Primary: Internal Medicine

## 2021-06-16 ENCOUNTER — Encounter: Admit: 2021-06-16 | Payer: PRIVATE HEALTH INSURANCE | Attending: Internal Medicine | Primary: Internal Medicine

## 2021-12-14 ENCOUNTER — Encounter: Admit: 2021-12-14 | Payer: PRIVATE HEALTH INSURANCE | Attending: Internal Medicine | Primary: Internal Medicine

## 2021-12-14 MED ORDER — FENOFIBRATE NANOCRYSTALLIZED 145 MG TABLET
145 | ORAL_TABLET | Freq: Every day | ORAL | 4 refills | 90.00000 days | Status: AC
Start: 2021-12-14 — End: 2022-06-21

## 2021-12-14 MED ORDER — CYANOCOBALAMIN (VIT B-12) 2,500 MCG SUBLINGUAL TABLET
2500 | SUBLINGUAL | 1 refills | 84.00000 days | Status: DC
Start: 2021-12-14 — End: 2022-06-21

## 2021-12-14 MED ORDER — METFORMIN ER 500 MG TABLET,EXTENDED RELEASE 24 HR
500 | ORAL_TABLET | ORAL | 4 refills | 90.00000 days | Status: AC
Start: 2021-12-14 — End: 2022-06-21

## 2021-12-29 ENCOUNTER — Telehealth: Admit: 2021-12-29 | Payer: PRIVATE HEALTH INSURANCE | Primary: Internal Medicine

## 2021-12-29 ENCOUNTER — Encounter: Admit: 2021-12-29 | Payer: PRIVATE HEALTH INSURANCE | Attending: Internal Medicine | Primary: Internal Medicine

## 2021-12-29 ENCOUNTER — Ambulatory Visit: Admit: 2021-12-29 | Payer: BLUE CROSS/BLUE SHIELD | Attending: Internal Medicine | Primary: Internal Medicine

## 2021-12-29 MED ORDER — CETIRIZINE 10 MG TABLET
10 | Freq: Every day | ORAL | 1.00 refills | 90.00000 days | Status: AC
Start: 2021-12-29 — End: ?

## 2021-12-29 MED ORDER — PREDNISONE 20 MG TABLET
20 | ORAL_TABLET | ORAL | 1 refills | 12.00000 days | Status: AC
Start: 2021-12-29 — End: 2022-06-21

## 2022-02-26 ENCOUNTER — Ambulatory Visit: Admit: 2022-02-26 | Payer: BLUE CROSS/BLUE SHIELD | Primary: Internal Medicine

## 2022-03-01 ENCOUNTER — Inpatient Hospital Stay: Admit: 2022-03-01 | Discharge: 2022-03-01 | Payer: BLUE CROSS/BLUE SHIELD | Primary: Internal Medicine

## 2022-03-01 DIAGNOSIS — Z1231 Encounter for screening mammogram for malignant neoplasm of breast: Secondary | ICD-10-CM

## 2022-06-03 ENCOUNTER — Encounter: Admit: 2022-06-03 | Payer: PRIVATE HEALTH INSURANCE | Attending: Internal Medicine | Primary: Internal Medicine

## 2022-06-04 ENCOUNTER — Encounter: Admit: 2022-06-04 | Payer: PRIVATE HEALTH INSURANCE | Primary: Internal Medicine

## 2022-06-04 ENCOUNTER — Telehealth: Admit: 2022-06-04 | Payer: PRIVATE HEALTH INSURANCE | Attending: Internal Medicine | Primary: Internal Medicine

## 2022-06-21 ENCOUNTER — Encounter: Admit: 2022-06-21 | Payer: PRIVATE HEALTH INSURANCE | Attending: Internal Medicine | Primary: Internal Medicine

## 2022-06-21 ENCOUNTER — Ambulatory Visit: Admit: 2022-06-21 | Payer: BLUE CROSS/BLUE SHIELD | Attending: Internal Medicine | Primary: Internal Medicine

## 2022-06-21 MED ORDER — LOSARTAN 25 MG TABLET
25 | ORAL_TABLET | Freq: Every evening | ORAL | 4 refills | 90.00000 days | Status: AC
Start: 2022-06-21 — End: 2022-10-26

## 2022-06-21 MED ORDER — CYANOCOBALAMIN (VIT B-12) 2,500 MCG SUBLINGUAL TABLET
2500 | SUBLINGUAL | 1 refills | 84.00000 days | Status: AC
Start: 2022-06-21 — End: ?

## 2022-06-21 MED ORDER — SEMAGLUTIDE 0.25 MG OR 0.5 MG (2 MG/3 ML) SUBCUTANEOUS PEN INJECTOR
0.25 | SUBCUTANEOUS | 5 refills | 28.00000 days | Status: AC
Start: 2022-06-21 — End: 2022-10-26

## 2022-06-23 ENCOUNTER — Encounter: Admit: 2022-06-23 | Payer: PRIVATE HEALTH INSURANCE | Attending: Internal Medicine | Primary: Internal Medicine

## 2022-06-23 ENCOUNTER — Encounter: Admit: 2022-06-23 | Payer: PRIVATE HEALTH INSURANCE | Primary: Internal Medicine

## 2022-10-20 ENCOUNTER — Inpatient Hospital Stay: Admit: 2022-10-20 | Discharge: 2022-10-20 | Payer: BLUE CROSS/BLUE SHIELD | Primary: Internal Medicine

## 2022-10-20 DIAGNOSIS — I10 Essential (primary) hypertension: Secondary | ICD-10-CM

## 2022-10-26 ENCOUNTER — Encounter: Admit: 2022-10-26 | Payer: BLUE CROSS/BLUE SHIELD | Attending: Internal Medicine | Primary: Internal Medicine

## 2022-10-26 ENCOUNTER — Encounter: Admit: 2022-10-26 | Payer: PRIVATE HEALTH INSURANCE | Attending: Internal Medicine | Primary: Internal Medicine

## 2022-10-26 MED ORDER — ROSUVASTATIN 5 MG TABLET
5 | ORAL_TABLET | Freq: Every day | ORAL | 4 refills | 90.00000 days | Status: AC
Start: 2022-10-26 — End: ?

## 2022-10-26 MED ORDER — LOSARTAN 25 MG TABLET
25 | ORAL_TABLET | Freq: Two times a day (BID) | ORAL | 4 refills | 90.00000 days | Status: AC
Start: 2022-10-26 — End: ?

## 2022-10-26 MED ORDER — SEMAGLUTIDE 0.25 MG OR 0.5 MG (2 MG/3 ML) SUBCUTANEOUS PEN INJECTOR
0.25 | SUBCUTANEOUS | 5 refills | 28.00000 days | Status: AC
Start: 2022-10-26 — End: 2023-05-19

## 2022-10-27 ENCOUNTER — Encounter: Admit: 2022-10-27 | Payer: PRIVATE HEALTH INSURANCE | Attending: Internal Medicine | Primary: Internal Medicine

## 2022-11-12 ENCOUNTER — Encounter: Admit: 2022-11-12 | Payer: PRIVATE HEALTH INSURANCE | Attending: Internal Medicine | Primary: Internal Medicine

## 2022-11-16 ENCOUNTER — Encounter: Admit: 2022-11-16 | Payer: PRIVATE HEALTH INSURANCE | Attending: Internal Medicine | Primary: Internal Medicine

## 2023-03-01 ENCOUNTER — Encounter: Admit: 2023-03-01 | Payer: PRIVATE HEALTH INSURANCE | Attending: Internal Medicine | Primary: Internal Medicine

## 2023-03-01 ENCOUNTER — Encounter: Admit: 2023-03-01 | Payer: BLUE CROSS/BLUE SHIELD | Attending: Internal Medicine | Primary: Internal Medicine

## 2023-03-01 VITALS — BP 136/90 | HR 90 | Ht 63.0 in | Wt 162.6 lb

## 2023-03-01 DIAGNOSIS — E7800 Pure hypercholesterolemia, unspecified: Secondary | ICD-10-CM

## 2023-03-01 DIAGNOSIS — E119 Type 2 diabetes mellitus without complications: Secondary | ICD-10-CM

## 2023-03-01 DIAGNOSIS — E663 Overweight: Secondary | ICD-10-CM

## 2023-03-01 DIAGNOSIS — O039 Complete or unspecified spontaneous abortion without complication: Secondary | ICD-10-CM

## 2023-03-01 DIAGNOSIS — I10 Essential (primary) hypertension: Secondary | ICD-10-CM

## 2023-03-01 DIAGNOSIS — Z789 Other specified health status: Secondary | ICD-10-CM

## 2023-03-01 DIAGNOSIS — Z23 Encounter for immunization: Secondary | ICD-10-CM

## 2023-03-01 DIAGNOSIS — K8020 Calculus of gallbladder without cholecystitis without obstruction: Secondary | ICD-10-CM

## 2023-03-01 DIAGNOSIS — J45909 Unspecified asthma, uncomplicated: Secondary | ICD-10-CM

## 2023-03-01 DIAGNOSIS — K760 Fatty (change of) liver, not elsewhere classified: Secondary | ICD-10-CM

## 2023-03-01 DIAGNOSIS — E669 Obesity, unspecified: Secondary | ICD-10-CM

## 2023-05-18 ENCOUNTER — Encounter: Admit: 2023-05-18 | Payer: PRIVATE HEALTH INSURANCE | Attending: Internal Medicine | Primary: Internal Medicine

## 2023-05-18 DIAGNOSIS — E119 Type 2 diabetes mellitus without complications: Secondary | ICD-10-CM

## 2023-05-19 MED ORDER — OZEMPIC 0.25 MG OR 0.5 MG (2 MG/3 ML) SUBCUTANEOUS PEN INJECTOR
0.25 | SUBCUTANEOUS | 7 refills | 28.00000 days | Status: AC
Start: 2023-05-19 — End: 2023-09-21

## 2023-06-28 ENCOUNTER — Encounter: Admit: 2023-06-28 | Payer: PRIVATE HEALTH INSURANCE | Attending: Internal Medicine | Primary: Internal Medicine

## 2023-07-08 MED ORDER — FENOFIBRATE NANOCRYSTALLIZED 145 MG TABLET
145 | ORAL_TABLET | Freq: Every day | ORAL | 4 refills | 90.00000 days | Status: AC
Start: 2023-07-08 — End: ?

## 2023-08-03 MED ORDER — LOSARTAN 25 MG TABLET
25 | ORAL_TABLET | Freq: Two times a day (BID) | ORAL | 1 refills | 90.00000 days | Status: AC
Start: 2023-08-03 — End: ?

## 2023-08-03 MED ORDER — ROSUVASTATIN 5 MG TABLET
5 | ORAL_TABLET | Freq: Every day | ORAL | 1 refills | 90.00000 days | Status: AC
Start: 2023-08-03 — End: ?

## 2023-09-21 MED ORDER — FERROUS SULFATE, DRIED ER 160 MG (50 MG IRON) TABLET,EXTENDED RELEASE
160 | ORAL_TABLET | ORAL | 12 refills | 60.00000 days | Status: AC
Start: 2023-09-21 — End: ?

## 2023-09-21 MED ORDER — TIRZEPATIDE (WEIGHT LOSS) 7.5 MG/0.5 ML SUBCUTANEOUS PEN INJECTOR
7.5 | SUBCUTANEOUS | 5 refills | 28.00000 days | Status: AC
Start: 2023-09-21 — End: ?

## 2023-09-21 MED ORDER — CYANOCOBALAMIN (VIT B-12) 1,000 MCG SUBLINGUAL TABLET
1000 | INTRAMUSCULAR | 0.00 refills | 90.00000 days | Status: AC
Start: 2023-09-21 — End: ?

## 2023-10-12 ENCOUNTER — Inpatient Hospital Stay: Admit: 2023-10-12 | Discharge: 2023-10-12 | Payer: PRIVATE HEALTH INSURANCE | Primary: Internal Medicine

## 2023-10-12 ENCOUNTER — Encounter: Admit: 2023-10-12 | Payer: PRIVATE HEALTH INSURANCE | Primary: Internal Medicine

## 2023-10-12 VITALS — Ht 63.0 in | Wt 165.0 lb

## 2023-10-12 DIAGNOSIS — E669 Obesity, unspecified: Secondary | ICD-10-CM

## 2023-10-12 DIAGNOSIS — Z789 Other specified health status: Secondary | ICD-10-CM

## 2023-10-12 DIAGNOSIS — Z1231 Encounter for screening mammogram for malignant neoplasm of breast: Secondary | ICD-10-CM

## 2023-10-12 DIAGNOSIS — O039 Complete or unspecified spontaneous abortion without complication: Secondary | ICD-10-CM

## 2023-10-12 DIAGNOSIS — K760 Fatty (change of) liver, not elsewhere classified: Secondary | ICD-10-CM

## 2023-10-12 DIAGNOSIS — J45909 Unspecified asthma, uncomplicated: Secondary | ICD-10-CM

## 2023-10-12 DIAGNOSIS — K8020 Calculus of gallbladder without cholecystitis without obstruction: Principal | ICD-10-CM

## 2023-10-21 ENCOUNTER — Encounter: Admit: 2023-10-21 | Payer: PRIVATE HEALTH INSURANCE | Attending: Internal Medicine | Primary: Internal Medicine

## 2023-10-31 ENCOUNTER — Encounter: Admit: 2023-10-31 | Payer: PRIVATE HEALTH INSURANCE | Attending: Internal Medicine | Primary: Internal Medicine

## 2023-11-25 ENCOUNTER — Encounter: Admit: 2023-11-25 | Payer: PRIVATE HEALTH INSURANCE | Attending: Internal Medicine | Primary: Internal Medicine

## 2024-01-01 ENCOUNTER — Encounter: Admit: 2024-01-01 | Payer: PRIVATE HEALTH INSURANCE | Attending: Internal Medicine | Primary: Internal Medicine

## 2024-01-02 ENCOUNTER — Encounter: Admit: 2024-01-02 | Payer: PRIVATE HEALTH INSURANCE | Attending: Internal Medicine | Primary: Internal Medicine

## 2024-01-02 DIAGNOSIS — E119 Type 2 diabetes mellitus without complications: Principal | ICD-10-CM

## 2024-01-02 DIAGNOSIS — E663 Overweight: Secondary | ICD-10-CM

## 2024-01-02 MED ORDER — TIRZEPATIDE (WEIGHT LOSS) 7.5 MG/0.5 ML SUBCUTANEOUS PEN INJECTOR
7.5 | SUBCUTANEOUS | 5 refills | 28.00000 days | Status: AC
Start: 2024-01-02 — End: ?

## 2024-01-04 ENCOUNTER — Encounter: Admit: 2024-01-04 | Payer: PRIVATE HEALTH INSURANCE | Attending: Internal Medicine | Primary: Internal Medicine

## 2024-01-11 ENCOUNTER — Encounter: Admit: 2024-01-11 | Payer: PRIVATE HEALTH INSURANCE | Attending: Internal Medicine | Primary: Internal Medicine

## 2024-01-20 ENCOUNTER — Telehealth: Admit: 2024-01-20 | Payer: PRIVATE HEALTH INSURANCE | Attending: Internal Medicine | Primary: Internal Medicine

## 2024-01-20 DIAGNOSIS — E663 Overweight: Principal | ICD-10-CM

## 2024-01-20 MED ORDER — TIRZEPATIDE (WEIGHT LOSS) 7.5 MG/0.5 ML SUBCUTANEOUS SOLUTION
7.5 | SUBCUTANEOUS | 4 refills | 28.00000 days | Status: AC
Start: 2024-01-20 — End: ?

## 2024-03-27 ENCOUNTER — Encounter: Admit: 2024-03-27 | Payer: PRIVATE HEALTH INSURANCE | Attending: Internal Medicine | Primary: Internal Medicine
